# Patient Record
Sex: Female | Born: 1980 | Race: Black or African American | Hispanic: No | Marital: Married | Smoking: Former smoker
Health system: Southern US, Community
[De-identification: ages and names within clinical notes are randomized; demographics above are authoritative.]

## PROBLEM LIST (undated history)

## (undated) DIAGNOSIS — E282 Polycystic ovarian syndrome: Secondary | ICD-10-CM

## (undated) DIAGNOSIS — I10 Essential (primary) hypertension: Secondary | ICD-10-CM

## (undated) DIAGNOSIS — Z8619 Personal history of other infectious and parasitic diseases: Secondary | ICD-10-CM

## (undated) HISTORY — PX: CARDIAC SURGERY: SHX584

## (undated) HISTORY — DX: Polycystic ovarian syndrome: E28.2

## (undated) HISTORY — DX: Personal history of other infectious and parasitic diseases: Z86.19

---
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
## 2002-03-09 ENCOUNTER — Emergency Department (HOSPITAL_COMMUNITY): Admission: EM | Admit: 2002-03-09 | Discharge: 2002-03-09 | Payer: Self-pay | Admitting: Emergency Medicine

## 2002-03-09 ENCOUNTER — Encounter: Payer: Self-pay | Admitting: Emergency Medicine

## 2006-06-05 ENCOUNTER — Inpatient Hospital Stay (HOSPITAL_COMMUNITY): Admission: AD | Admit: 2006-06-05 | Discharge: 2006-06-05 | Payer: Self-pay | Admitting: Family Medicine

## 2007-04-09 ENCOUNTER — Ambulatory Visit (HOSPITAL_COMMUNITY): Admission: RE | Admit: 2007-04-09 | Discharge: 2007-04-09 | Payer: Self-pay | Admitting: Obstetrics and Gynecology

## 2007-06-15 ENCOUNTER — Emergency Department (HOSPITAL_COMMUNITY): Admission: EM | Admit: 2007-06-15 | Discharge: 2007-06-15 | Payer: Self-pay | Admitting: Emergency Medicine

## 2007-10-16 ENCOUNTER — Emergency Department (HOSPITAL_COMMUNITY): Admission: EM | Admit: 2007-10-16 | Discharge: 2007-10-17 | Payer: Self-pay | Admitting: Emergency Medicine

## 2007-12-07 ENCOUNTER — Emergency Department (HOSPITAL_COMMUNITY): Admission: EM | Admit: 2007-12-07 | Discharge: 2007-12-07 | Payer: Self-pay | Admitting: Emergency Medicine

## 2008-01-18 ENCOUNTER — Emergency Department (HOSPITAL_COMMUNITY): Admission: EM | Admit: 2008-01-18 | Discharge: 2008-01-18 | Payer: Self-pay | Admitting: Emergency Medicine

## 2008-02-12 ENCOUNTER — Emergency Department (HOSPITAL_COMMUNITY): Admission: EM | Admit: 2008-02-12 | Discharge: 2008-02-12 | Payer: Self-pay | Admitting: Emergency Medicine

## 2009-02-03 ENCOUNTER — Emergency Department (HOSPITAL_COMMUNITY): Admission: EM | Admit: 2009-02-03 | Discharge: 2009-02-04 | Payer: Self-pay | Admitting: Emergency Medicine

## 2009-08-21 ENCOUNTER — Emergency Department (HOSPITAL_COMMUNITY): Admission: EM | Admit: 2009-08-21 | Discharge: 2009-08-21 | Payer: Self-pay | Admitting: Emergency Medicine

## 2010-11-18 ENCOUNTER — Emergency Department (HOSPITAL_COMMUNITY)
Admission: EM | Admit: 2010-11-18 | Discharge: 2010-11-18 | Payer: Self-pay | Source: Home / Self Care | Admitting: Emergency Medicine

## 2011-01-12 ENCOUNTER — Emergency Department (HOSPITAL_COMMUNITY): Payer: Self-pay

## 2011-01-12 ENCOUNTER — Emergency Department (HOSPITAL_COMMUNITY)
Admission: EM | Admit: 2011-01-12 | Discharge: 2011-01-12 | Disposition: A | Discharge status: Home / Self Care | Payer: Self-pay | Attending: Emergency Medicine | Admitting: Emergency Medicine

## 2011-01-12 DIAGNOSIS — R3915 Urgency of urination: Secondary | ICD-10-CM | POA: Insufficient documentation

## 2011-01-12 DIAGNOSIS — R110 Nausea: Secondary | ICD-10-CM | POA: Insufficient documentation

## 2011-01-12 DIAGNOSIS — I10 Essential (primary) hypertension: Secondary | ICD-10-CM | POA: Insufficient documentation

## 2011-01-12 DIAGNOSIS — N72 Inflammatory disease of cervix uteri: Secondary | ICD-10-CM | POA: Insufficient documentation

## 2011-01-12 DIAGNOSIS — R109 Unspecified abdominal pain: Secondary | ICD-10-CM | POA: Insufficient documentation

## 2011-01-12 LAB — POCT PREGNANCY, URINE: Preg Test, Ur: NEGATIVE

## 2011-01-12 LAB — URINALYSIS, ROUTINE W REFLEX MICROSCOPIC
Bilirubin Urine: NEGATIVE
Glucose, UA: NEGATIVE mg/dL
Hgb urine dipstick: NEGATIVE
Ketones, ur: NEGATIVE mg/dL
Nitrite: NEGATIVE
Protein, ur: NEGATIVE mg/dL
Specific Gravity, Urine: 1.006 (ref 1.005–1.030)
Urobilinogen, UA: 0.2 mg/dL (ref 0.0–1.0)
pH: 7.5 (ref 5.0–8.0)

## 2011-01-12 LAB — URINE MICROSCOPIC-ADD ON

## 2011-01-12 LAB — WET PREP, GENITAL

## 2011-01-13 LAB — GC/CHLAMYDIA PROBE AMP, GENITAL: GC Probe Amp, Genital: NEGATIVE

## 2011-01-13 LAB — URINE CULTURE

## 2011-02-10 LAB — DIFFERENTIAL
Basophils Absolute: 0.1 10*3/uL (ref 0.0–0.1)
Eosinophils Relative: 3 % (ref 0–5)
Monocytes Absolute: 0.5 10*3/uL (ref 0.1–1.0)
Neutrophils Relative %: 53 % (ref 43–77)

## 2011-02-10 LAB — CBC
MCV: 90.4 fL (ref 78.0–100.0)
Platelets: 289 10*3/uL (ref 150–400)
WBC: 9 10*3/uL (ref 4.0–10.5)

## 2011-02-10 LAB — BASIC METABOLIC PANEL
BUN: 5 mg/dL — ABNORMAL LOW (ref 6–23)
Creatinine, Ser: 0.8 mg/dL (ref 0.4–1.2)
GFR calc non Af Amer: >60 mL/min (ref >60)

## 2011-02-10 LAB — URINALYSIS, ROUTINE W REFLEX MICROSCOPIC
Bilirubin Urine: NEGATIVE
Hgb urine dipstick: NEGATIVE
Specific Gravity, Urine: 1.007 (ref 1.005–1.030)
pH: 7 (ref 5.0–8.0)

## 2011-02-10 LAB — URINE MICROSCOPIC-ADD ON

## 2011-02-10 LAB — WET PREP, GENITAL

## 2011-02-10 LAB — PREGNANCY, URINE: Preg Test, Ur: NEGATIVE

## 2011-02-10 LAB — GC/CHLAMYDIA PROBE AMP, GENITAL: GC Probe Amp, Genital: NEGATIVE

## 2011-02-17 LAB — CBC
HCT: 43 % (ref 36.0–46.0)
Hemoglobin: 15.1 g/dL — ABNORMAL HIGH (ref 12.0–15.0)
MCHC: 35 g/dL (ref 30.0–36.0)
MCV: 88.7 fL (ref 78.0–100.0)
RBC: 4.85 MIL/uL (ref 3.87–5.11)

## 2011-02-17 LAB — URINALYSIS, ROUTINE W REFLEX MICROSCOPIC
Bilirubin Urine: NEGATIVE
Glucose, UA: NEGATIVE mg/dL
Hgb urine dipstick: NEGATIVE
Protein, ur: NEGATIVE mg/dL
Urobilinogen, UA: 1 mg/dL (ref 0.0–1.0)

## 2011-02-17 LAB — URINE MICROSCOPIC-ADD ON

## 2011-02-17 LAB — POCT I-STAT, CHEM 8
Calcium, Ion: 1.08 mmol/L — ABNORMAL LOW (ref 1.12–1.32)
Glucose, Bld: 77 mg/dL (ref 70–99)
HCT: 45 % (ref 36.0–46.0)
Hemoglobin: 15.3 g/dL — ABNORMAL HIGH (ref 12.0–15.0)
TCO2: 24 mmol/L (ref 0–100)

## 2011-02-17 LAB — DIFFERENTIAL
Basophils Relative: 0 % (ref 0–1)
Eosinophils Absolute: 0.4 10*3/uL (ref 0.0–0.7)
Eosinophils Relative: 3 % (ref 0–5)
Monocytes Absolute: 0.4 10*3/uL (ref 0.1–1.0)
Monocytes Relative: 3 % (ref 3–12)
Neutrophils Relative %: 63 % (ref 43–77)

## 2011-03-25 NOTE — H&P (Signed)
[facility]  Patient:    Heather Fernandez, Heather Fernandez Visit Number: [id number] MRN: [id number]          Service Type: Attending:  Darreld Mclean, M.D. Dictated by:   Darreld Mclean, M.D.                           History and Physical  CHIEF COMPLAINT:  I hurt my ankle roller-skating.  HISTORY:  This is a 30 year old African-American female who injured her right ankle roller-skating in [place] today.  She fell and had severe pain and tenderness in the right ankle.  She was brought to the hospital.  X-rays revealed a fracture of the right ankle, the distal fibula, distal third with separation, and opening up of the mortis and a small avulsion fracture off of the tip of the medial malleolus and a small fracture of the posterior malleolus.  Therefore she has a trimalleolar fracture.  No other injuries were sustained.  CURRENT MEDICATIONS:  None.  ALLERGIES:  No known drug allergies.  PAST MEDICAL HISTORY:  All negative.  PHYSICAL EXAMINATION  GENERAL:  The patient is alert, cooperative, and oriented.  VITAL SIGNS:  Within normal limits.  HEENT:  Negative.  NECK:  Supple.  LUNGS:  Clear to P&A.  HEART:  Regular, without murmur heard.  ABDOMEN:  Soft, nontender, without masses, slightly obese.  EXTREMITIES:  The right ankle is swollen and tender, with decreased range of motion secondary to pain.  Other extremities are within normal limits.  SKIN:  Intact.  IMPRESSION:  Trauma with a fracture of the right ankle.  PLAN:  An OTIF of the right ankle.  I discussed with the patient the planned procedure, the risks and imponderables.  She appears to understands and agrees to proceed.  Other studies and labs are pending.  The risks and imponderables, including infection, need for crutches, decreased ambulation, and possibility of infection, and the anesthesia risks.  She can have general or spinal, depending upon what she would desire.  She will go home tonight  and come back early in the morning.  I told her about being n.p.o.  A prescription for Tylox given for pain. Dictated by:   Darreld Mclean, M.D. Attending:  Darreld Mclean, M.D. DD:  03/09/02 TD:  03/09/02 Job: [id number]

## 2011-03-27 ENCOUNTER — Observation Stay (HOSPITAL_COMMUNITY)
Admission: EM | Admit: 2011-03-27 | Discharge: 2011-03-29 | DRG: 313 | Disposition: A | Discharge status: Home / Self Care | Payer: Self-pay | Source: Other Acute Inpatient Hospital | Attending: Cardiovascular Disease | Admitting: Cardiovascular Disease

## 2011-03-27 DIAGNOSIS — E669 Obesity, unspecified: Secondary | ICD-10-CM | POA: Insufficient documentation

## 2011-03-27 DIAGNOSIS — I498 Other specified cardiac arrhythmias: Secondary | ICD-10-CM | POA: Insufficient documentation

## 2011-03-27 DIAGNOSIS — I10 Essential (primary) hypertension: Secondary | ICD-10-CM | POA: Insufficient documentation

## 2011-03-27 DIAGNOSIS — R55 Syncope and collapse: Principal | ICD-10-CM | POA: Insufficient documentation

## 2011-03-27 DIAGNOSIS — R0789 Other chest pain: Secondary | ICD-10-CM | POA: Insufficient documentation

## 2011-03-27 DIAGNOSIS — Z95810 Presence of automatic (implantable) cardiac defibrillator: Secondary | ICD-10-CM | POA: Insufficient documentation

## 2011-03-27 LAB — MRSA PCR SCREENING: MRSA by PCR: NEGATIVE

## 2011-03-28 ENCOUNTER — Inpatient Hospital Stay (HOSPITAL_COMMUNITY): Payer: Self-pay

## 2011-03-28 LAB — URINALYSIS, ROUTINE W REFLEX MICROSCOPIC
Ketones, ur: NEGATIVE mg/dL
Nitrite: NEGATIVE
Specific Gravity, Urine: 1.019 (ref 1.005–1.030)
Urobilinogen, UA: 0.2 mg/dL (ref 0.0–1.0)
pH: 6 (ref 5.0–8.0)

## 2011-03-28 LAB — LIPID PANEL
Cholesterol: 183 mg/dL (ref 0–200)
LDL Cholesterol: 130 mg/dL — ABNORMAL HIGH (ref 0–99)
Triglycerides: 155 mg/dL — ABNORMAL HIGH (ref <150)
VLDL: 31 mg/dL (ref 0–40)

## 2011-03-28 LAB — COMPREHENSIVE METABOLIC PANEL
AST: 14 U/L (ref 0–37)
Albumin: 3.6 g/dL (ref 3.5–5.2)
CO2: 26 mEq/L (ref 19–32)
Calcium: 9.1 mg/dL (ref 8.4–10.5)
Creatinine, Ser: 0.79 mg/dL (ref 0.4–1.2)
GFR calc Af Amer: >60 mL/min (ref >60)
GFR calc non Af Amer: >60 mL/min (ref >60)

## 2011-03-28 LAB — CBC
Hemoglobin: 13.4 g/dL (ref 12.0–15.0)
MCH: 30.8 pg (ref 26.0–34.0)
MCHC: 34.7 g/dL (ref 30.0–36.0)
Platelets: 290 10*3/uL (ref 150–400)
RBC: 4.35 MIL/uL (ref 3.87–5.11)

## 2011-03-28 LAB — HEMOGLOBIN A1C
Hgb A1c MFr Bld: 5.3 % (ref <5.7)
Mean Plasma Glucose: 105 mg/dL (ref <117)

## 2011-03-28 LAB — CARDIAC PANEL(CRET KIN+CKTOT+MB+TROPI)
Relative Index: INVALID (ref 0.0–2.5)
Total CK: 61 U/L (ref 7–177)
Total CK: 64 U/L (ref 7–177)
Troponin I: <0.3 ng/mL (ref <0.30)

## 2011-03-28 LAB — TSH: TSH: 2.454 u[IU]/mL (ref 0.350–4.500)

## 2011-03-28 MED ORDER — IOHEXOL 350 MG/ML SOLN
100.0000 mL | Freq: Once | INTRAVENOUS | Status: AC | PRN
  Administered 2011-03-28: 100 mL via INTRAVENOUS

## 2011-03-29 DIAGNOSIS — R55 Syncope and collapse: Secondary | ICD-10-CM

## 2011-03-29 DIAGNOSIS — R002 Palpitations: Secondary | ICD-10-CM

## 2011-04-01 NOTE — Consult Note (Signed)
[name], Heather NO.:  [id number]  MEDICAL RECORD NO.:  [id number]           PATIENT TYPE:  I  LOCATION:  2915                         FACILITY:  [facility]  PHYSICIAN:  Hillis Range, MD       DATE OF BIRTH:  August 07, 1981  DATE OF CONSULTATION: DATE OF DISCHARGE:  03/29/2011                                CONSULTATION   REQUESTING PHYSICIAN:  Landry Corporal, MD  REASON FOR CONSULTATION:  Syncope and tachycardia.  HISTORY OF PRESENT ILLNESS:  [name] is a pleasant 30 year old female with a longstanding postural hypotension and syncope as well as hypertension who presents following an episode of syncope.  She states that since high school she has had episodes of syncope.  Episodes are always upon standing.  She denies any prior syncope at rest while sitting or lying down.  She previously was evaluated and had a loop monitor implanted.  She has had no arrhythmias or syncope previously documented since her loop implantation.  She reports doing reasonably well recently.  She does have difficulties with hypertension long term. She states that on Mar 27, 2011, that she was at church when she began having a shocking "sensation" throughout her chest.  These were fleeting pain within her chest.  She reports being quite anxious with this and subsequently short of breath.  She states that she then became very nervous and felt that her heart rate gradually increased.  She stood up and went to go outside of the church when she had lost consciousness. She apparently had prolonged loss of consciousness.  Per report, CPR was delivered; however, we have a documentation of arrhythmias.  The patient was subsequently brought to [facility].  Upon EMS arrival per report, the patient was stable, and her vitals were quite normal. Apparently, her blood pressure was 119/65 with the heart rate of 97. She was brought to [facility] for further management.  Her  loop interrogator has interrogated and reveals a gradual onset tachycardia. No other arrhythmias have been observed.  Presently, the patient was resting comfortably and is without complaint.  PAST MEDICAL HISTORY: 1. Obesity. 2. Hypertension. 3. Postural syncope, status post implantable loop recorder     implantation.  ALLERGIES:  HYDROCODONE and TYLENOL.  HOME MEDICATIONS: 1. Lisinopril 30 mg daily. 2. Hydrochlorothiazide previously 100 mg daily, most recently on 50 mg     daily. 3. Metoprolol 50 mg b.i.d.  SOCIAL HISTORY:  The patient is married and has no children.  She works as a Lawyer.  She recently quit smoking.  She denies alcohol or drug use.  FAMILY HISTORY:  She denies any family history of syncope or sudden death.  REVIEW OF SYSTEMS:  All systems were reviewed and negative except as outlined in the HPI above.  PHYSICAL EXAMINATION:  GENERAL:  Telemetry reveals sinus rhythm at 70 beats per minute. VITAL SIGNS:  Blood pressure 116/90, heart rate 63, respirations 18, sats 97% on room air, afebrile. GENERAL:  The patient is an obese, but pleasant female, in no acute distress.  She is alert and oriented x3. HEENT:  Normocephalic,  atraumatic.  Sclerae clear.  Conjunctivae pink. Oropharynx clear. NECK:  Supple.  No thyromegaly, JVD, or bruits. LUNGS:  Clear to auscultation bilaterally. HEART:  Regular rate and rhythm.  No murmurs, rubs, or gallops. GI:  Soft, nontender, nondistended.  Positive bowel sounds. EXTREMITIES:  No clubbing, cyanosis, or edema. SKIN:  No ecchymoses or lacerations. MUSCULOSKELETAL:  No deformity or atrophy. PSYCH:  Euthymic mood.  Full affect.  EKG reveals sinus rhythm at 78 beats per minute and is otherwise a normal EKG.  Echocardiogram reveals an ejection fraction of 55-60% with no wall motion abnormalities.  CT scan reveals no evidence of pulmonary emboli. Cardiac markers are negative.  TSH 2.45.  IMPLANTABLE LOOP RECORDER  INTERROGATION:  The patient's loop recorder was interrogated and found to be a Medtronic XT model 9529 device.  She has had transient episodes of sinus tachycardia documented on Mar 27, 2011, and previously in June 20, 2010.  These were the only arrhythmias observed.  These are very clearly a gradual onset of sinus tachycardia.  Heart rate is up to 180 beats per minute.  No other arrhythmias are observed.  IMPRESSION:  [name] is a pleasant 30 year old female with longstanding postural hypotension and syncope who now presents with a syncopal episode.  By history, it appears that she became anxious after having atypical chest pain and developed sinus tachycardia in the setting of this anxiety.  She then got up quickly to go out of the church and apparently had an episode of postural syncope.  I think that the patient's high doses of hydrochlorothiazide are certainly contributing to her postural syncope, and should therefore be eliminated if possible.  We will decrease hydrochlorothiazide today to 25 mg daily and hopefully can be subsequently discontinued if the blood pressure remains stable.  Her loop was interrogated today and reveals only sinus tachycardia.  If she has any further arrhythmias, then we will be happy to evaluate her further.  No further EP recommendations are made at this time.     Hillis Range, MD     JA/MEDQ  D:  03/29/2011  T:  03/30/2011  Job:  [id number]  Electronically Signed by Hillis Range MD on 04/01/2011 05:55:15 PM

## 2011-04-12 ENCOUNTER — Other Ambulatory Visit (HOSPITAL_COMMUNITY): Payer: Self-pay | Admitting: Internal Medicine

## 2011-04-21 ENCOUNTER — Ambulatory Visit (HOSPITAL_COMMUNITY)
Admission: RE | Admit: 2011-04-21 | Discharge: 2011-04-21 | Disposition: A | Discharge status: Home / Self Care | Payer: Self-pay | Source: Ambulatory Visit | Attending: Internal Medicine | Admitting: Internal Medicine

## 2011-04-21 ENCOUNTER — Ambulatory Visit (HOSPITAL_COMMUNITY): Payer: Self-pay

## 2011-04-21 ENCOUNTER — Other Ambulatory Visit (HOSPITAL_COMMUNITY): Payer: Self-pay | Admitting: Internal Medicine

## 2011-04-21 ENCOUNTER — Ambulatory Visit (HOSPITAL_COMMUNITY): Admission: RE | Admit: 2011-04-21 | Payer: Self-pay | Source: Ambulatory Visit

## 2011-04-21 DIAGNOSIS — R079 Chest pain, unspecified: Secondary | ICD-10-CM | POA: Insufficient documentation

## 2011-04-21 DIAGNOSIS — I10 Essential (primary) hypertension: Secondary | ICD-10-CM | POA: Insufficient documentation

## 2011-04-21 DIAGNOSIS — R55 Syncope and collapse: Secondary | ICD-10-CM | POA: Insufficient documentation

## 2011-04-21 MED ORDER — TECHNETIUM TC 99M TETROFOSMIN IV KIT
30.0000 | PACK | Freq: Once | INTRAVENOUS | Status: AC | PRN
  Administered 2011-04-21: 30 via INTRAVENOUS

## 2011-04-21 MED ORDER — TECHNETIUM TC 99M TETROFOSMIN IV KIT
10.0000 | PACK | Freq: Once | INTRAVENOUS | Status: AC | PRN
  Administered 2011-04-21: 10 via INTRAVENOUS

## 2011-04-21 NOTE — Discharge Summary (Signed)
Heather Fernandez, Heather Fernandez NO.:  [id number]  MEDICAL RECORD NO.:  [id number]           PATIENT TYPE:  I  LOCATION:  2915                         FACILITY:  [facility]  PHYSICIAN:  Nicki Guadalajara, M.D.     DATE OF BIRTH:  Sep 17, 1981  DATE OF ADMISSION:  03/27/2011 DATE OF DISCHARGE:  03/29/2011                              DISCHARGE SUMMARY   DISCHARGE DIAGNOSES: 1. Atypical chest pain with associated sinus tachycardia. 2. Syncope. 3. History of longstanding postural syncope. 4. Abnormal D-dimer negative for pulmonary embolus by CT angio. 5. Normal LV function by 2-D echo. 6. History of loop recorder revealing sinus tach at 180-190 on the     loop recorder.  DISCHARGE CONDITION:  Stable.  DISCHARGE INSTRUCTIONS: 1. Activity as tolerated.  Heart-healthy diet.  Follow up with Dr.     Rennis Golden, on April 13, 2011, at noon. 2. You are scheduled for stress test on April 11, 2011, at 11:15 a.m.,     wear a two-piece clothing, she was to walk in, no perfume or strong     deodorant, do not eat or drink after midnight the night before the     test.  DISCHARGE MEDICATIONS: 1. Aspirin 81 mg daily. 2. Hydrochlorothiazide new dose only 25 mg daily. 3. Metoprolol tartrate new dose 75 mg twice a day. 4. Lisinopril 30 mg daily as before.  Please note, 2-D echo revealed systolic function was normal, EF 55-60% with normal wall motion.  No regional wall motion abnormalities.  Left ventricular diastolic function parameters were normal.  Aortic valve, no regurgitation, aortic root was normal.  Mitral valve, no regurgitation. Tricuspid valve, no regurgitation.  No pericardial effusion.  Please note, EP consult was obtained, sinus tachycardia.  HISTORY OF PRESENT ILLNESS:  A 30 year old female was admitted from [facility] after a syncopal episode.  She has a history of tachycardia, palpitations and postural syncope.  On the day of admission, she was sitting in church and she stated  her defibrillator fired several times.  Unfortunately, this is a misunderstanding of a loop recorder.  She does not have an ICD.  I am not sure what she felt, but when she got up to go outside she awoke with people standing over her.  The patient told to the admitting provider that her sister did CPR on her.  The family stated she was unconscious for 10 minutes.  She also states her defibrillator has fired several times in the past and she also states she has no memory as to what happened to her.  At [facility], she was in sinus rhythm at 83.  She complained of headache which continued despite Toradol and morphine.  CT of her head was negative. Cardiac enzymes were negative and it was found she actually had a loop recorder which was placed for irregular heartbeat.  PAST MEDICAL HISTORY: 1. Medtronic reveal a loop implant. 2. Hypertension. 3. Obesity. 4. History of ankle fracture or other history.  She had seen Dr. Theodis Aguas and Dr. Lynnea Ferrier in the past and will now be seeing Dr. Rennis Golden.  She was kempt.  Her D-dimer was  found to be elevated.  CT angio of her chest was done which was negative for any pulmonary embolus and no intrathoracic abnormalities.  Chest x-ray on admission, no acute chest findings.  LABORATORY DATA:  Hemoglobin 13.7, hematocrit 38.6, platelets 290, WBC 10.2, D-dimer as stated was elevated at 2.06.  Sodium 140, potassium 3.5, chloride 105, CO2 26, glucose 96, BUN 12, creatinine 0.79, total bili 0.3, alkaline phos 64, SGOT 14, SGPT 14, total protein 7.3, albumin 3.6, calcium 9.1, magnesium 2.4, hemoglobin A1c 5.3.  CK is 66-64, MB 1.1 and troponin I less than 0.30.  Total cholesterol 183, triglycerides 155, HDL 22, LDL 130.  TSH 2.454. UA was clear and a MRSA screen was negative.  Also, she had a pregnancy urine test done at [facility] which was negative for pregnancy.  EKG revealed sinus rhythm, normal EKG.  The patient was seen and examined, an EP was to be  consulted, they saw her and Dr. Johney Frame felt this was atypical chest pain with associated sinus tachycardia, long- standing postural syncope.  He felt she should have her HCTZ decreased to 25 daily and would eventually wean off and she needs to maintain her preload.  She should not drive, and follow up with Dr. Rennis Golden her primary cardiologist at this point.  On the loop recorder, she had gradual episodes of tachycardia with heart rate up to 188.  He felt she was stable to be discharged and she will follow up as instructed.  We will also do an outpatient stress test with her chest pain that she had.  Please note, she was seen at [facility].  Originally, her name was [employer].     Heather Fernandez. Heather Fernandez, N.P.   ______________________________ Nicki Guadalajara, M.D.    LRI/MEDQ  D:  03/29/2011  T:  03/30/2011  Job:  [id number]  cc:   Italy Hilty, MD  Electronically Signed by Nada Boozer N.P. on 04/06/2011 04:39:18 PM Electronically Signed by Nicki Guadalajara M.D. on 04/21/2011 04:10:14 PM

## 2011-05-05 ENCOUNTER — Ambulatory Visit (HOSPITAL_COMMUNITY)
Admission: RE | Admit: 2011-05-05 | Discharge: 2011-05-05 | Disposition: A | Discharge status: Home / Self Care | Payer: Self-pay | Source: Ambulatory Visit | Attending: Cardiovascular Disease | Admitting: Cardiovascular Disease

## 2011-05-05 DIAGNOSIS — F17200 Nicotine dependence, unspecified, uncomplicated: Secondary | ICD-10-CM | POA: Insufficient documentation

## 2011-05-05 DIAGNOSIS — R55 Syncope and collapse: Secondary | ICD-10-CM | POA: Insufficient documentation

## 2011-05-05 DIAGNOSIS — I10 Essential (primary) hypertension: Secondary | ICD-10-CM | POA: Insufficient documentation

## 2011-05-07 ENCOUNTER — Emergency Department (HOSPITAL_COMMUNITY)
Admission: EM | Admit: 2011-05-07 | Discharge: 2011-05-07 | Disposition: A | Discharge status: Home / Self Care | Payer: Self-pay | Attending: Emergency Medicine | Admitting: Emergency Medicine

## 2011-05-07 DIAGNOSIS — Z7982 Long term (current) use of aspirin: Secondary | ICD-10-CM | POA: Insufficient documentation

## 2011-05-07 DIAGNOSIS — I10 Essential (primary) hypertension: Secondary | ICD-10-CM | POA: Insufficient documentation

## 2011-05-07 DIAGNOSIS — Z79899 Other long term (current) drug therapy: Secondary | ICD-10-CM | POA: Insufficient documentation

## 2011-05-07 DIAGNOSIS — R110 Nausea: Secondary | ICD-10-CM | POA: Insufficient documentation

## 2011-05-07 DIAGNOSIS — R071 Chest pain on breathing: Secondary | ICD-10-CM | POA: Insufficient documentation

## 2011-05-07 LAB — CBC
HCT: 39.4 % (ref 36.0–46.0)
Hemoglobin: 13.6 g/dL (ref 12.0–15.0)
MCH: 30.4 pg (ref 26.0–34.0)
MCV: 87.9 fL (ref 78.0–100.0)
RBC: 4.48 MIL/uL (ref 3.87–5.11)

## 2011-05-07 LAB — BASIC METABOLIC PANEL
BUN: 6 mg/dL (ref 6–23)
CO2: 27 mEq/L (ref 19–32)
Calcium: 9.2 mg/dL (ref 8.4–10.5)
Chloride: 103 mEq/L (ref 96–112)
Creatinine, Ser: 0.8 mg/dL (ref 0.50–1.10)
Glucose, Bld: 93 mg/dL (ref 70–99)

## 2011-05-14 NOTE — Op Note (Signed)
  [name], [name] NO.:  [id number]  MEDICAL RECORD NO.:  [id number]  LOCATION:  [facility]                         FACILITY:  [facility]  PHYSICIAN:  Thurmon Fair, MD     DATE OF BIRTH:  [birth year]  DATE OF PROCEDURE:  05/05/2011 DATE OF DISCHARGE:  05/05/2011                              OPERATIVE REPORT   PROCEDURE PERFORMED:  Removal of implantable loop recorder.  [name] is a young woman with previous unexplained palpitations and syncope who has had an implantable loop recorder in place for 2 years. No arrhythmia has been recorded.  No recurrent syncope has been noted.  MEDICATIONS ADMINISTERED DURING PROCEDURE:  Ancef 1 g intravenously, lidocaine 1% 20 mL locally, Versed and fentanyl for analgesia and light sedation.  DEVICE DETAIL:  The explanted loop recorder is a Medtronic Reveal XT 9529 inserted on October 07, 2009, serial number is [id number].  COMPLICATIONS:  None.  ESTIMATED BLOOD LOSS:  Less than 10 mL.  After risks and benefits of the procedure were described, the patient provided informed consent and was brought to the Cardiac Cath Lab in the fasting state.  Local anesthesia with 1% lidocaine was administered to the area of the previous scar.  A 2.5-cm horizontal incision was created at the level of previous scar and the pocket was carefully exposed using blunt dissection and limited electrocautery.  The loop recorder was easily removed from the pocket and the pocket was then closed in layers, a deep layer of 2-0 Vicryl and a subcuticular layer of 4-0 Vicryl were used.  A sterile dressing was applied.  Estimated blood loss was less than 10 mL.  No complications occurred.     Thurmon Fair, MD     MC/MEDQ  D:  05/05/2011  T:  05/06/2011  Job:  [id number]  cc:   [facility]  Electronically Signed by Thurmon Fair M.D. on 05/14/2011 08:18:06 AM

## 2011-07-21 ENCOUNTER — Emergency Department (HOSPITAL_COMMUNITY)
Admission: EM | Admit: 2011-07-21 | Discharge: 2011-07-21 | Disposition: A | Discharge status: Home / Self Care | Payer: Self-pay | Attending: Emergency Medicine | Admitting: Emergency Medicine

## 2011-07-21 DIAGNOSIS — I10 Essential (primary) hypertension: Secondary | ICD-10-CM | POA: Insufficient documentation

## 2011-07-21 DIAGNOSIS — I498 Other specified cardiac arrhythmias: Secondary | ICD-10-CM | POA: Insufficient documentation

## 2011-07-21 DIAGNOSIS — R0789 Other chest pain: Secondary | ICD-10-CM | POA: Insufficient documentation

## 2011-07-21 LAB — POCT I-STAT, CHEM 8
BUN: 7 mg/dL (ref 6–23)
Creatinine, Ser: 0.8 mg/dL (ref 0.50–1.10)
Glucose, Bld: 101 mg/dL — ABNORMAL HIGH (ref 70–99)
Hemoglobin: 14.6 g/dL (ref 12.0–15.0)
TCO2: 27 mmol/L (ref 0–100)

## 2011-07-28 LAB — BASIC METABOLIC PANEL
CO2: 30
Calcium: 9.3
Chloride: 106
Creatinine, Ser: 0.87
GFR calc Af Amer: >60
Sodium: 140

## 2011-07-28 LAB — URINALYSIS, ROUTINE W REFLEX MICROSCOPIC
Glucose, UA: NEGATIVE
Hgb urine dipstick: NEGATIVE
Protein, ur: NEGATIVE
Specific Gravity, Urine: 1.011
Urobilinogen, UA: 0.2

## 2011-07-28 LAB — PREGNANCY, URINE: Preg Test, Ur: NEGATIVE

## 2011-07-28 LAB — DIFFERENTIAL
Lymphocytes Relative: 34
Lymphs Abs: 3.3
Monocytes Absolute: 0.8
Monocytes Relative: 9
Neutro Abs: 4.8
Neutrophils Relative %: 50

## 2011-07-28 LAB — CBC
Hemoglobin: 14.3
RBC: 4.68
WBC: 9.5

## 2011-08-01 LAB — URINALYSIS, ROUTINE W REFLEX MICROSCOPIC
Bilirubin Urine: NEGATIVE
Glucose, UA: NEGATIVE
Nitrite: NEGATIVE
Specific Gravity, Urine: 1.015
pH: 7

## 2011-08-01 LAB — CBC
HCT: 40.2
Hemoglobin: 13.7
MCHC: 34.2
MCV: 87.6
RBC: 4.58
WBC: 9.8

## 2011-08-01 LAB — I-STAT 8, (EC8 V) (CONVERTED LAB)
BUN: 6
Bicarbonate: 25.3 — ABNORMAL HIGH
HCT: 45
Operator id: 285841
TCO2: 26
pCO2, Ven: 40 — ABNORMAL LOW

## 2011-08-01 LAB — DIFFERENTIAL
Basophils Relative: 0
Eosinophils Relative: 0
Monocytes Relative: 2 — ABNORMAL LOW
Neutrophils Relative %: 56
nRBC: 0

## 2011-08-01 LAB — POCT CARDIAC MARKERS
Myoglobin, poc: 44.8
Operator id: 285841

## 2011-08-01 LAB — POCT I-STAT CREATININE: Creatinine, Ser: 0.8

## 2011-08-03 ENCOUNTER — Emergency Department (HOSPITAL_COMMUNITY)
Admission: EM | Admit: 2011-08-03 | Discharge: 2011-08-03 | Disposition: A | Discharge status: Home / Self Care | Payer: Self-pay | Attending: Emergency Medicine | Admitting: Emergency Medicine

## 2011-08-03 ENCOUNTER — Emergency Department (HOSPITAL_COMMUNITY): Admission: EM | Admit: 2011-08-03 | Payer: Self-pay | Source: Home / Self Care

## 2011-08-03 DIAGNOSIS — R51 Headache: Secondary | ICD-10-CM | POA: Insufficient documentation

## 2011-08-03 DIAGNOSIS — M62838 Other muscle spasm: Secondary | ICD-10-CM | POA: Insufficient documentation

## 2011-08-03 DIAGNOSIS — M79609 Pain in unspecified limb: Secondary | ICD-10-CM | POA: Insufficient documentation

## 2011-08-03 DIAGNOSIS — M542 Cervicalgia: Secondary | ICD-10-CM | POA: Insufficient documentation

## 2011-08-03 DIAGNOSIS — I10 Essential (primary) hypertension: Secondary | ICD-10-CM | POA: Insufficient documentation

## 2011-08-03 DIAGNOSIS — R55 Syncope and collapse: Secondary | ICD-10-CM | POA: Insufficient documentation

## 2011-08-03 DIAGNOSIS — R079 Chest pain, unspecified: Secondary | ICD-10-CM | POA: Insufficient documentation

## 2011-08-15 LAB — DIFFERENTIAL
Eosinophils Absolute: 0.3
Lymphocytes Relative: 39
Lymphs Abs: 3.9
Neutrophils Relative %: 49

## 2011-08-15 LAB — COMPREHENSIVE METABOLIC PANEL
ALT: 19
CO2: 27
Calcium: 9.2
Creatinine, Ser: 0.91
GFR calc non Af Amer: >60
Glucose, Bld: 85

## 2011-08-15 LAB — URINALYSIS, ROUTINE W REFLEX MICROSCOPIC
Bilirubin Urine: NEGATIVE
Hgb urine dipstick: NEGATIVE
Specific Gravity, Urine: 1.01
pH: 7.5

## 2011-08-15 LAB — CBC
Hemoglobin: 14.4
MCHC: 33.9
MCV: 88.7
RBC: 4.77

## 2011-08-15 LAB — POCT PREGNANCY, URINE: Preg Test, Ur: NEGATIVE

## 2011-08-15 LAB — LIPASE, BLOOD: Lipase: 23

## 2011-08-22 LAB — DIFFERENTIAL
Basophils Absolute: 0
Basophils Relative: 1
Eosinophils Absolute: 0.4
Eosinophils Relative: 4
Lymphocytes Relative: 35

## 2011-08-22 LAB — I-STAT 8, (EC8 V) (CONVERTED LAB)
Acid-base deficit: 1
Bicarbonate: 24.4 — ABNORMAL HIGH
HCT: 50 — ABNORMAL HIGH
Operator id: 234501
TCO2: 26
pCO2, Ven: 40.8 — ABNORMAL LOW

## 2011-08-22 LAB — CBC
HCT: 46.3 — ABNORMAL HIGH
MCHC: 34
MCV: 88.1
Platelets: 311
RDW: 13.5

## 2011-08-22 LAB — POCT I-STAT CREATININE: Operator id: 234501

## 2011-08-22 LAB — URINALYSIS, ROUTINE W REFLEX MICROSCOPIC
Hgb urine dipstick: NEGATIVE
Ketones, ur: NEGATIVE
Protein, ur: NEGATIVE
Urobilinogen, UA: 0.2

## 2011-08-22 LAB — POCT PREGNANCY, URINE: Preg Test, Ur: NEGATIVE

## 2011-08-22 LAB — PREGNANCY, URINE: Preg Test, Ur: NEGATIVE

## 2011-09-05 ENCOUNTER — Emergency Department (HOSPITAL_COMMUNITY)
Admission: EM | Admit: 2011-09-05 | Discharge: 2011-09-05 | Disposition: A | Discharge status: Home / Self Care | Payer: Self-pay | Attending: Emergency Medicine | Admitting: Emergency Medicine

## 2011-09-05 DIAGNOSIS — Y93G1 Activity, food preparation and clean up: Secondary | ICD-10-CM | POA: Insufficient documentation

## 2011-09-05 DIAGNOSIS — W261XXA Contact with sword or dagger, initial encounter: Secondary | ICD-10-CM | POA: Insufficient documentation

## 2011-09-05 DIAGNOSIS — M79609 Pain in unspecified limb: Secondary | ICD-10-CM | POA: Insufficient documentation

## 2011-09-05 DIAGNOSIS — I10 Essential (primary) hypertension: Secondary | ICD-10-CM | POA: Insufficient documentation

## 2011-09-05 DIAGNOSIS — W260XXA Contact with knife, initial encounter: Secondary | ICD-10-CM | POA: Insufficient documentation

## 2011-09-05 DIAGNOSIS — S61209A Unspecified open wound of unspecified finger without damage to nail, initial encounter: Secondary | ICD-10-CM | POA: Insufficient documentation

## 2011-09-05 DIAGNOSIS — Z79899 Other long term (current) drug therapy: Secondary | ICD-10-CM | POA: Insufficient documentation

## 2011-09-19 ENCOUNTER — Other Ambulatory Visit (HOSPITAL_COMMUNITY)
Admission: RE | Admit: 2011-09-19 | Discharge: 2011-09-19 | Disposition: A | Discharge status: Home / Self Care | Payer: Medicaid Other | Source: Ambulatory Visit | Attending: Obstetrics & Gynecology | Admitting: Obstetrics & Gynecology

## 2011-09-19 DIAGNOSIS — Z113 Encounter for screening for infections with a predominantly sexual mode of transmission: Secondary | ICD-10-CM | POA: Insufficient documentation

## 2011-09-19 DIAGNOSIS — Z01419 Encounter for gynecological examination (general) (routine) without abnormal findings: Secondary | ICD-10-CM | POA: Insufficient documentation

## 2012-02-01 ENCOUNTER — Other Ambulatory Visit (HOSPITAL_COMMUNITY)
Admission: RE | Admit: 2012-02-01 | Discharge: 2012-02-01 | Disposition: A | Discharge status: Home / Self Care | Payer: Medicaid Other | Source: Ambulatory Visit | Attending: Obstetrics and Gynecology | Admitting: Obstetrics and Gynecology

## 2012-02-01 DIAGNOSIS — Z01419 Encounter for gynecological examination (general) (routine) without abnormal findings: Secondary | ICD-10-CM | POA: Insufficient documentation

## 2012-02-01 DIAGNOSIS — Z113 Encounter for screening for infections with a predominantly sexual mode of transmission: Secondary | ICD-10-CM | POA: Insufficient documentation

## 2012-04-01 ENCOUNTER — Encounter (HOSPITAL_COMMUNITY): Payer: Self-pay | Admitting: *Deleted

## 2012-04-01 ENCOUNTER — Emergency Department (HOSPITAL_COMMUNITY)
Admission: EM | Admit: 2012-04-01 | Discharge: 2012-04-02 | Disposition: A | Discharge status: Home / Self Care | Payer: Medicaid Other | Attending: Emergency Medicine | Admitting: Emergency Medicine

## 2012-04-01 ENCOUNTER — Emergency Department (HOSPITAL_COMMUNITY): Payer: Medicaid Other

## 2012-04-01 DIAGNOSIS — W2209XA Striking against other stationary object, initial encounter: Secondary | ICD-10-CM | POA: Insufficient documentation

## 2012-04-01 DIAGNOSIS — T148XXA Other injury of unspecified body region, initial encounter: Secondary | ICD-10-CM

## 2012-04-01 DIAGNOSIS — Y9289 Other specified places as the place of occurrence of the external cause: Secondary | ICD-10-CM | POA: Insufficient documentation

## 2012-04-01 DIAGNOSIS — R51 Headache: Secondary | ICD-10-CM | POA: Insufficient documentation

## 2012-04-01 DIAGNOSIS — I10 Essential (primary) hypertension: Secondary | ICD-10-CM | POA: Insufficient documentation

## 2012-04-01 DIAGNOSIS — S0083XA Contusion of other part of head, initial encounter: Secondary | ICD-10-CM | POA: Insufficient documentation

## 2012-04-01 DIAGNOSIS — S0003XA Contusion of scalp, initial encounter: Secondary | ICD-10-CM | POA: Insufficient documentation

## 2012-04-01 HISTORY — DX: Essential (primary) hypertension: I10

## 2012-04-01 NOTE — ED Notes (Signed)
Pt reports striking head at work on Thursday.  States that since that time, she's had a headache that doesn't get better with OTC Aleve.  Reports occasional dizziness and sleepiness.  Reports some nausea, no vomiting.

## 2012-04-02 MED ORDER — PROMETHAZINE HCL 25 MG PO TABS: 12.5000 mg | ORAL_TABLET | Freq: Four times a day (QID) | ORAL | Status: DC | PRN

## 2012-04-02 MED ORDER — ACETAMINOPHEN 500 MG PO TABS
ORAL_TABLET | ORAL | Status: AC
  Filled 2012-04-02: qty 2

## 2012-04-02 MED ORDER — ACETAMINOPHEN 500 MG PO TABS: 1000.0000 mg | ORAL_TABLET | Freq: Once | ORAL | Status: DC

## 2012-04-02 NOTE — ED Provider Notes (Signed)
History     CSN: [id number]  Arrival date & time 04/01/12  2323   First MD Initiated Contact with Patient 04/01/12 2337      Chief Complaint  Patient presents with  . Headache    (Consider location/radiation/quality/duration/timing/severity/associated sxs/prior treatment) HPI  Linlee APT is a 31 y.o. female who presents to the Emergency Department complaining of headache, nausea, dizziness and sleepiness after having hit her head while at work on Thursday. She hit the left temporal area with the handlebar of a loading platform. At the time there was no LOC. She did have focal pain at the site. Subsequently she has developed headaches, some dizziness on occasion, and sleepiness. She is also to experience some nausea but no vomiting. She denies fever, chills, cough, shortness of breath. She has taken Tylenol for the headache with some relief.  No PCP Past Medical History  Diagnosis Date  . Hypertension     Past Surgical History  Procedure Date  . Cardiac surgery     History reviewed. No pertinent family history.  History  Substance Use Topics  . Smoking status: Current Everyday Smoker  . Smokeless tobacco: Not on file  . Alcohol Use: No    OB History    Grav Para Term Preterm Abortions TAB SAB Ect Mult Living                  Review of Systems  Constitutional: Negative for fever.       10 Systems reviewed and are negative for acute change except as noted in the HPI.  HENT: Negative for congestion.   Eyes: Negative for discharge and redness.  Respiratory: Negative for cough and shortness of breath.   Cardiovascular: Negative for chest pain.  Gastrointestinal: Negative for vomiting and abdominal pain.  Musculoskeletal: Negative for back pain.  Skin: Negative for rash.  Neurological: Positive for dizziness and headaches. Negative for syncope and numbness.  Psychiatric/Behavioral:       No behavior change.    Allergies  Review of patient's allergies indicates  no known allergies.  Home Medications   Current Outpatient Rx  Name Route Sig Dispense Refill  . HYDROCHLOROTHIAZIDE 25 MG PO TABS Oral Take 25 mg by mouth daily.    Marland Kitchen LISINOPRIL 20 MG PO TABS Oral Take 20 mg by mouth daily.    Marland Kitchen LISINOPRIL 5 MG PO TABS Oral Take 5 mg by mouth daily.      BP 147/100  Pulse 92  Temp 98.2 F (36.8 C)  Resp 18  Ht 4\' 11"  (1.499 m)  Wt 195 lb (88.451 kg)  BMI 39.39 kg/m2  SpO2 99%  LMP 03/18/2012  Physical Exam  Nursing note and vitals reviewed. Constitutional: She is oriented to person, place, and time.       Awake, alert, nontoxic appearance.  HENT:  Head: Atraumatic.  Eyes: Right eye exhibits no discharge. Left eye exhibits no discharge.  Neck: Neck supple.  Cardiovascular: Normal rate, normal heart sounds and intact distal pulses.   Pulmonary/Chest: Effort normal. She exhibits no tenderness.  Abdominal: Soft. There is no tenderness. There is no rebound.  Musculoskeletal: She exhibits no tenderness.       Baseline ROM, no obvious new focal weakness.  Neurological: She is alert and oriented to person, place, and time. She has normal reflexes. She displays normal reflexes. No cranial nerve deficit. Coordination normal.       Mental status and motor strength appears baseline for patient and situation.  Skin:  No rash noted.  Psychiatric: She has a normal mood and affect.    ED Course  Procedures (including critical care time)  Ct Head Wo Contrast  04/02/2012  *RADIOLOGY REPORT*  Clinical Data:  Hit head 4 days ago.  Headache with nausea.  CT HEAD WITHOUT CONTRAST  Technique:  Contiguous axial images were obtained from the base of the skull through the vertex without contrast  Comparison:  None.  Findings:  The brain has a normal appearance without evidence for hemorrhage, acute infarction, hydrocephalus, or mass lesion.  There is no extra axial fluid collection.  The skull and paranasal sinuses are normal.  IMPRESSION: Normal CT of the head  without contrast.  Original Report Authenticated By: Elsie Stain, M.D.        MDM  Patient here status post hitting her head at work on Thursday with results sent intermittent headache, nausea, dizziness, and sleepiness. CT is negative for any acute process. We reviewed the results with the patient.Pt stable in ED with no significant deterioration in condition.The patient appears reasonably screened and/or stabilized for discharge and I doubt any other medical condition or other [facility] requiring further screening, evaluation, or treatment in the ED at this time prior to discharge.  MDM Reviewed: nursing note and vitals Interpretation: CT scan           Nicoletta Dress. Colon Branch, MD 04/02/12 1610

## 2012-04-02 NOTE — Discharge Instructions (Signed)
Your  CT was negative. He may treat the headache with either Tylenol or ibuprofen. Use the nausea medicine as needed. Her scalp may be similar at the area where it was hit. This is a bruise and will go away by itself after several days.    Contusion A contusion is a deep bruise. Contusions happen when an injury causes bleeding under the skin. Signs of bruising include pain, puffiness (swelling), and discolored skin. The contusion may turn blue, purple, or yellow. HOME CARE   Put ice on the injured area.   Put ice in a plastic bag.   Place a towel between your skin and the bag.   Leave the ice on for 15 to 20 minutes, 3 to 4 times a day.   Only take medicine as told by your doctor.   Rest the injured area.   If possible, raise (elevate) the injured area to lessen puffiness.  GET HELP RIGHT AWAY IF:   You have more bruising or puffiness.   You have pain that is getting worse.   Your puffiness or pain is not helped by medicine.  MAKE SURE YOU:   Understand these instructions.   Will watch your condition.   Will get help right away if you are not doing well or get worse.  Document Released: 04/11/2008 Document Revised: 10/13/2011 Document Reviewed: 08/29/2011 [facility] Patient Information 2012 [place], [place].

## 2012-04-02 NOTE — ED Notes (Signed)
Patient c/o increased pain to head.

## 2012-06-12 ENCOUNTER — Encounter (HOSPITAL_COMMUNITY): Payer: Self-pay

## 2012-06-12 ENCOUNTER — Emergency Department (HOSPITAL_COMMUNITY)
Admission: EM | Admit: 2012-06-12 | Discharge: 2012-06-13 | Disposition: A | Discharge status: Home / Self Care | Payer: Medicaid Other | Attending: Emergency Medicine | Admitting: Emergency Medicine

## 2012-06-12 DIAGNOSIS — Z87891 Personal history of nicotine dependence: Secondary | ICD-10-CM | POA: Insufficient documentation

## 2012-06-12 DIAGNOSIS — R112 Nausea with vomiting, unspecified: Secondary | ICD-10-CM

## 2012-06-12 DIAGNOSIS — I10 Essential (primary) hypertension: Secondary | ICD-10-CM | POA: Insufficient documentation

## 2012-06-12 DIAGNOSIS — R109 Unspecified abdominal pain: Secondary | ICD-10-CM

## 2012-06-12 DIAGNOSIS — R1011 Right upper quadrant pain: Secondary | ICD-10-CM | POA: Insufficient documentation

## 2012-06-12 DIAGNOSIS — Z79899 Other long term (current) drug therapy: Secondary | ICD-10-CM | POA: Insufficient documentation

## 2012-06-12 MED ORDER — ONDANSETRON HCL 4 MG/2ML IJ SOLN
4.0000 mg | Freq: Once | INTRAMUSCULAR | Status: AC
  Administered 2012-06-13: 4 mg via INTRAVENOUS
  Filled 2012-06-12: qty 2

## 2012-06-12 MED ORDER — SODIUM CHLORIDE 0.9 % IV BOLUS (SEPSIS)
1000.0000 mL | Freq: Once | INTRAVENOUS | Status: AC
  Administered 2012-06-13: 1000 mL via INTRAVENOUS

## 2012-06-12 NOTE — ED Notes (Signed)
Lab at bedside to draw blood.

## 2012-06-12 NOTE — ED Notes (Signed)
MD at bedside. 

## 2012-06-12 NOTE — ED Notes (Signed)
Pt c/o pain in mid abd and now with vomiting

## 2012-06-12 NOTE — ED Provider Notes (Signed)
History  This chart was scribed for Vida Roller, MD by Erskine Emery. This patient was seen in room [facility] and the patient's care was started at 23:32.   CSN: [id number]  Arrival date & time 06/12/12  2303   First MD Initiated Contact with Patient 06/12/12 2332      Chief Complaint  Patient presents with  . Abdominal Pain  . Emesis    (Consider location/radiation/quality/duration/timing/severity/associated sxs/prior treatment) The history is provided by the patient and the spouse.   Heather Fernandez is a 31 y.o. female who presents to the Emergency Department complaining of nausea and emesis (red in color) with associated abdominal pain and cough after each emesis episode since 8pm last night. Pt reports she was just sitting on the cough when the symptoms began. Pt reports between then and 6am this morning, she has about 6-8 episodes of emesis. Pt reports she has been having loose stools, but that is baseline for her. Pt denies any fevers, chills, dysuria, difficulty urinating, or abnormal exertion. Pt denies being on any new medications but is taking prenatal vitamins in an effort to get pregnant. Pt reports she hasn't taken a pregnancy test in a while. Her LNMP was in June but she had major vaginal bleeding last month for 2 weeks, not at the time of her usual period.  Pt is G1 P0 A1, and did not get sick during her first pregnancy. Pt denies taking any medication for the nausea PTA and she has no h/o abdominal surgery.   Past Medical History  Diagnosis Date  . Hypertension     Past Surgical History  Procedure Date  . Cardiac surgery     No family history on file.  History  Substance Use Topics  . Smoking status: Former Games developer  . Smokeless tobacco: Not on file  . Alcohol Use: No    OB History    Grav Para Term Preterm Abortions TAB SAB Ect Mult Living                  Review of Systems  All other systems reviewed and are negative.   A complete 10 system review  of systems was obtained and all systems are negative except as noted in the HPI and PMH.    Allergies  Tylenol  Home Medications   Current Outpatient Rx  Name Route Sig Dispense Refill  . HYDROCHLOROTHIAZIDE 25 MG PO TABS Oral Take 25 mg by mouth daily.    Marland Kitchen LISINOPRIL 5 MG PO TABS Oral Take 5 mg by mouth 3 (three) times daily.     Marland Kitchen LISINOPRIL 20 MG PO TABS Oral Take 20 mg by mouth daily.    Marland Kitchen ONDANSETRON 4 MG PO TBDP Oral Take 1 tablet (4 mg total) by mouth every 8 (eight) hours as needed for nausea. 10 tablet 0  . PROMETHAZINE HCL 25 MG PO TABS Oral Take 0.5 tablets (12.5 mg total) by mouth every 6 (six) hours as needed for nausea. 10 tablet 0  . PROMETHAZINE HCL 25 MG PO TABS Oral Take 1 tablet (25 mg total) by mouth every 6 (six) hours as needed for nausea. 12 tablet 0  . TRAMADOL HCL 50 MG PO TABS Oral Take 1 tablet (50 mg total) by mouth every 6 (six) hours as needed for pain. 15 tablet 0    Triage Vitals: BP 179/113  Pulse 109  Temp 97.9 F (36.6 C) (Oral)  Resp 16  Ht 4\' 10"  (1.473 m)  Wt  175 lb (79.379 kg)  BMI 36.57 kg/m2  SpO2 100%  LMP 04/21/2012  Physical Exam  Nursing note and vitals reviewed. Constitutional: She is oriented to person, place, and time. She appears well-developed and well-nourished. No distress.  HENT:  Head: Normocephalic and atraumatic.  Mouth/Throat: Oropharynx is clear and moist. No oropharyngeal exudate.  Eyes: EOM are normal.  Neck: Neck supple. No tracheal deviation present.  Cardiovascular: Normal heart sounds.  Tachycardia present.   No murmur heard.      Mildly tachycardic.  Pulmonary/Chest: Effort normal. No respiratory distress.  Abdominal: Soft. Bowel sounds are normal. She exhibits no distension.       Epigastric and RUQ tenderness. No CVA tenderness.    Musculoskeletal: Normal range of motion. She exhibits no edema.  Neurological: She is alert and oriented to person, place, and time.  Skin: Skin is warm and dry.    Psychiatric: She has a normal mood and affect.    ED Course  Procedures (including critical care time) DIAGNOSTIC STUDIES: Oxygen Saturation is 100% on room air, normal by my interpretation.    COORDINATION OF CARE: 23:40--I evaluated the patient and we discussed a treatment plan including fluids and nausea medication to which the pt agreed.     Labs Reviewed  CBC WITH DIFFERENTIAL - Abnormal; Notable for the following:    WBC 14.9 (*)     Neutro Abs 9.5 (*)     Lymphs Abs 4.3 (*)     All other components within normal limits  COMPREHENSIVE METABOLIC PANEL - Abnormal; Notable for the following:    AST 39 (*)     ALT 58 (*)     All other components within normal limits  URINALYSIS, ROUTINE W REFLEX MICROSCOPIC - Abnormal; Notable for the following:    Hgb urine dipstick TRACE (*)     Ketones, ur TRACE (*)     All other components within normal limits  URINE MICROSCOPIC-ADD ON - Abnormal; Notable for the following:    Squamous Epithelial / LPF FEW (*)     Bacteria, UA FEW (*)     All other components within normal limits  LIPASE, BLOOD  PREGNANCY, URINE  POCT PREGNANCY, URINE   Ct Abdomen Pelvis W Contrast  06/13/2012  *RADIOLOGY REPORT*  Clinical Data: Right upper quadrant abdominal pain and vomiting. Leukocytosis.  CT ABDOMEN AND PELVIS WITH CONTRAST  Technique:  Multidetector CT imaging of the abdomen and pelvis was performed following the standard protocol during bolus administration of intravenous contrast.  Contrast: OMNIPAQUE IOHEXOL 300 MG/ML  SOLN  Comparison: None.  Findings: Probable dependent atelectasis in the lung bases.  Diffuse low attenuation change throughout the liver consistent with mild fatty infiltration.  No focal liver lesions demonstrated.  The gallbladder is contracted.  The spleen, pancreas, adrenal glands, kidneys, abdominal aorta, and retroperitoneal lymph nodes are unremarkable.  The stomach, small bowel, and colon are not abnormally distended.   No free air or free fluid in the abdomen.  Pelvis:  The uterus and adnexal structures are not enlarged.  The bladder wall is not thickened.  No free or loculated pelvic fluid collections.  No evidence of diverticulitis.  The appendix is normal.  Normal alignment of the lumbar vertebrae.  IMPRESSION: No acute process demonstrated in the abdomen or pelvis.  Original Report Authenticated By: Marlon Pel, M.D.     1. Abdominal pain   2. Nausea and vomiting       MDM  Initially the patient  had pain in the right upper quadrant and epigastrium which waned throughout her visit. She had intermittent nausea and vomiting which also responded very well to medications. Her blood work showed that she had a leukocytosis and a slight transaminitis. I have explained to the patient that she has these findings and she has expressed her understanding and indications for followup. She will return 24 hours for a recheck should she still be having pain or return to see her Dr.  CT scan reading reviewed, no signs of appendicitis, cholecystitis or other abnormalities.   I personally performed the services described in this documentation, which was scribed in my presence. The recorded information has been reviewed and considered.    Discharge Prescriptions include:  Phenergan zofran Ultram      Vida Roller, MD 06/13/12 [phone]

## 2012-06-12 NOTE — ED Notes (Signed)
Patient has had several episodes of vomiting since last night. Burning pain right above umbilicus that comes and goes. Tender on palpation of middle abdomen. Last BM was this morning and normal.

## 2012-06-13 ENCOUNTER — Emergency Department (HOSPITAL_COMMUNITY): Payer: Medicaid Other

## 2012-06-13 LAB — CBC WITH DIFFERENTIAL/PLATELET
Basophils Relative: 0 % (ref 0–1)
Eosinophils Absolute: 0.2 10*3/uL (ref 0.0–0.7)
MCH: 30.8 pg (ref 26.0–34.0)
MCHC: 34.3 g/dL (ref 30.0–36.0)
Neutrophils Relative %: 64 % (ref 43–77)
Platelets: 368 10*3/uL (ref 150–400)
RBC: 4.26 MIL/uL (ref 3.87–5.11)

## 2012-06-13 LAB — COMPREHENSIVE METABOLIC PANEL
ALT: 58 U/L — ABNORMAL HIGH (ref 0–35)
Albumin: 4.1 g/dL (ref 3.5–5.2)
Alkaline Phosphatase: 65 U/L (ref 39–117)
Potassium: 3.8 mEq/L (ref 3.5–5.1)
Sodium: 136 mEq/L (ref 135–145)
Total Protein: 8.2 g/dL (ref 6.0–8.3)

## 2012-06-13 LAB — URINALYSIS, ROUTINE W REFLEX MICROSCOPIC
Bilirubin Urine: NEGATIVE
Nitrite: NEGATIVE
Specific Gravity, Urine: 1.02 (ref 1.005–1.030)
pH: 6 (ref 5.0–8.0)

## 2012-06-13 LAB — POCT PREGNANCY, URINE: Preg Test, Ur: NEGATIVE

## 2012-06-13 LAB — URINE MICROSCOPIC-ADD ON

## 2012-06-13 MED ORDER — PROMETHAZINE HCL 25 MG/ML IJ SOLN
25.0000 mg | Freq: Once | INTRAMUSCULAR | Status: AC
  Administered 2012-06-13: 25 mg via INTRAVENOUS
  Filled 2012-06-13: qty 1

## 2012-06-13 MED ORDER — PROMETHAZINE HCL 25 MG PO TABS: 25.0000 mg | ORAL_TABLET | Freq: Four times a day (QID) | ORAL | Status: DC | PRN

## 2012-06-13 MED ORDER — TRAMADOL HCL 50 MG PO TABS: 50.0000 mg | ORAL_TABLET | Freq: Four times a day (QID) | ORAL | Status: AC | PRN

## 2012-06-13 MED ORDER — HYDROMORPHONE HCL PF 1 MG/ML IJ SOLN
1.0000 mg | Freq: Once | INTRAMUSCULAR | Status: AC
  Administered 2012-06-13: 1 mg via INTRAVENOUS
  Filled 2012-06-13: qty 1

## 2012-06-13 MED ORDER — ONDANSETRON HCL 4 MG/2ML IJ SOLN
4.0000 mg | Freq: Once | INTRAMUSCULAR | Status: AC
  Administered 2012-06-13: 4 mg via INTRAVENOUS
  Filled 2012-06-13: qty 2

## 2012-06-13 MED ORDER — ONDANSETRON 4 MG PO TBDP
4.0000 mg | ORAL_TABLET | Freq: Three times a day (TID) | ORAL | Status: AC | PRN
Start: 2012-06-13 — End: 2012-06-20

## 2012-06-13 MED ORDER — IOHEXOL 300 MG/ML  SOLN
100.0000 mL | Freq: Once | INTRAMUSCULAR | Status: AC | PRN
  Administered 2012-06-13: 100 mL via INTRAVENOUS

## 2012-06-13 NOTE — ED Notes (Signed)
Actively vomiting at this time. MD aware.

## 2012-06-13 NOTE — ED Notes (Signed)
Into room to see patient. Resting sitting up in bed. States nausea is ok. Denies any needs. Tolerating contrast. Call bell within reach.

## 2012-06-13 NOTE — ED Notes (Signed)
Assisted to bathroom to obtain urine specimen clean catch.

## 2012-06-13 NOTE — ED Notes (Signed)
Denies any pain at this time. Nausea is better. Call bell within reach. Denies needs. Family at bedside. Will continue to monitor.

## 2012-06-13 NOTE — ED Notes (Signed)
Patient to radiology via stretcher. No distress. Denies pain. Nausea better. Call bell within reach. Will continue to monitor.

## 2012-06-13 NOTE — ED Notes (Signed)
Resting sitting up in bed. Medicated as ordered for 8\10 pain. Denies needs. No distress. Bed in low position and locked with side rails up. Will continue to monitor. Family at bedside.

## 2013-01-05 IMAGING — CR DG CHEST 1V PORT
1 series · 1 of 1 positions shown · non-contrast
Comparison: 08/21/2009

CLINICAL DATA: Chest pain

PORTABLE CHEST - 1 VIEW

[AP]
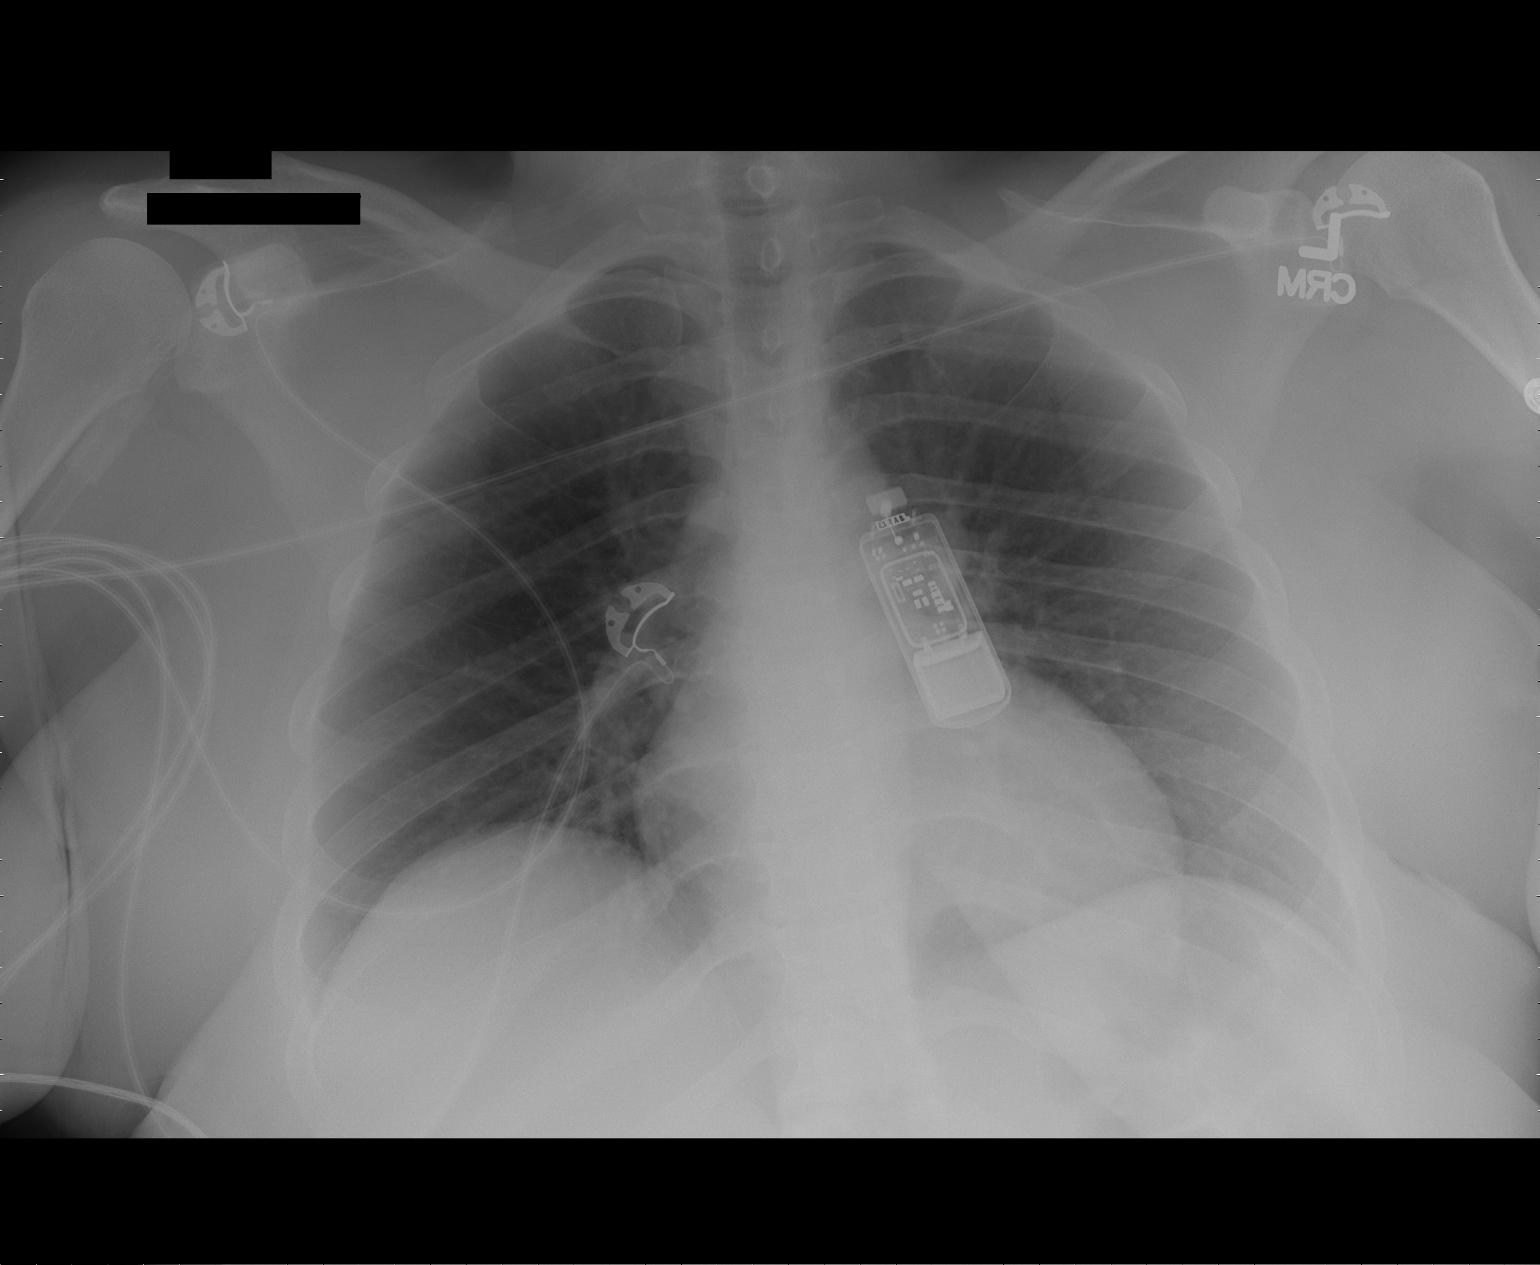

[1 of 1 positions shown; findings below may reference images not displayed]

FINDINGS: Normal heart size and vascularity.  Negative for edema,
pneumonia, effusion or pneumothorax.  External monitor device
projects over the left hilum.  Midline trachea.
IMPRESSION: No acute chest finding.

## 2013-02-21 ENCOUNTER — Other Ambulatory Visit: Payer: Self-pay | Admitting: Adult Health

## 2013-03-22 ENCOUNTER — Encounter: Payer: Self-pay | Admitting: *Deleted

## 2013-03-22 DIAGNOSIS — I10 Essential (primary) hypertension: Secondary | ICD-10-CM

## 2013-03-22 DIAGNOSIS — Z8619 Personal history of other infectious and parasitic diseases: Secondary | ICD-10-CM | POA: Insufficient documentation

## 2013-03-22 DIAGNOSIS — E282 Polycystic ovarian syndrome: Secondary | ICD-10-CM | POA: Insufficient documentation

## 2013-03-25 ENCOUNTER — Other Ambulatory Visit: Payer: Self-pay | Admitting: Adult Health

## 2013-03-25 ENCOUNTER — Encounter: Payer: Self-pay | Admitting: *Deleted

## 2014-03-24 IMAGING — CT CT ABD-PELV W/ CM
2 of 3 series · 16 of 46 positions shown, 18 images · IV contrast (omnipaque)
Comparison: None.

CLINICAL DATA: Right upper quadrant abdominal pain and vomiting.
Leukocytosis.

CT ABDOMEN AND PELVIS WITH CONTRAST
TECHNIQUE: Multidetector CT imaging of the abdomen and pelvis was
performed following the standard protocol during bolus
administration of intravenous contrast.
Contrast: 100mL OMNIPAQUE IOHEXOL 300 MG/ML  SOLN

[Series 2: abd_pel_with 5.0 b40f · axial · 0.70mm/px · z∈[-418,-12]mm · 13 of 93 slices shown, 15 images]
[im 6/93  soft-tissue]
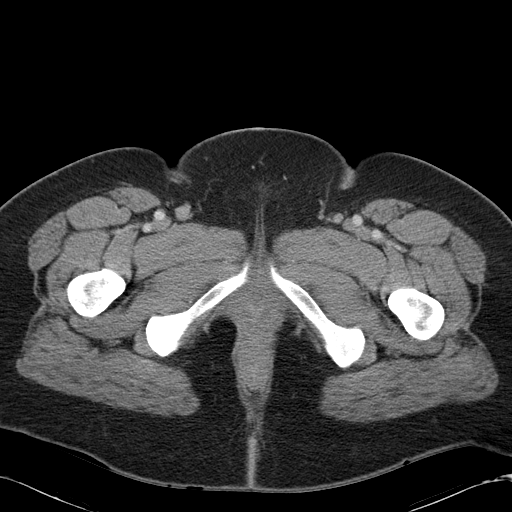
[im 6/93  bone]
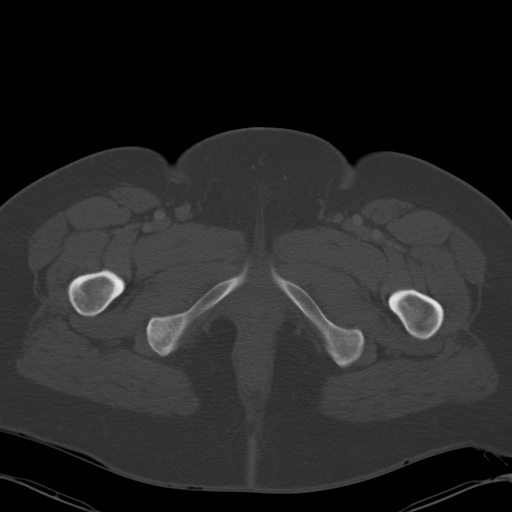
[im 12/93  soft-tissue]
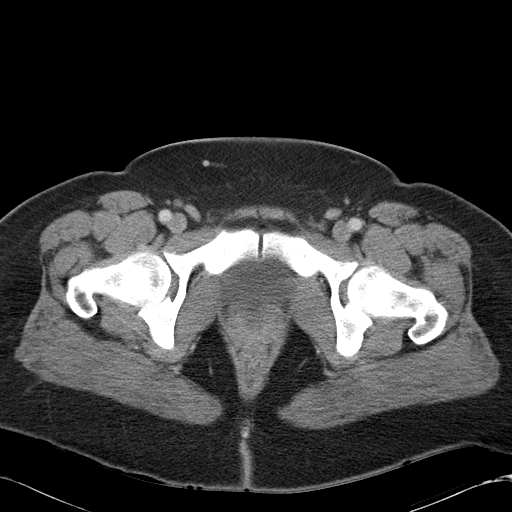
[im 18/93  soft-tissue]
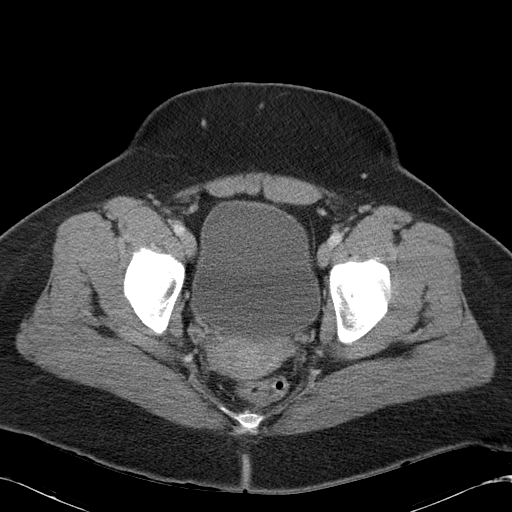
[im 27/93  soft-tissue]
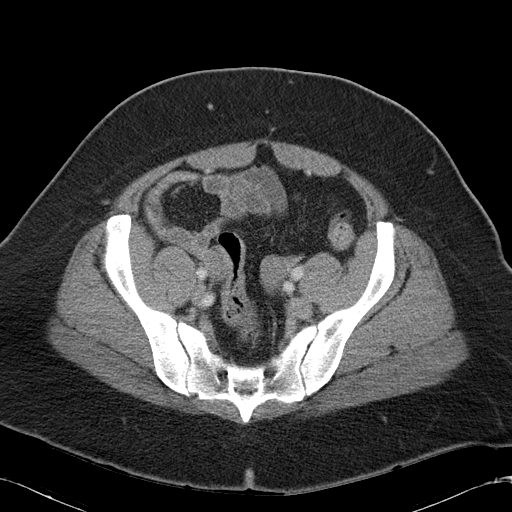
[im 33/93  soft-tissue]
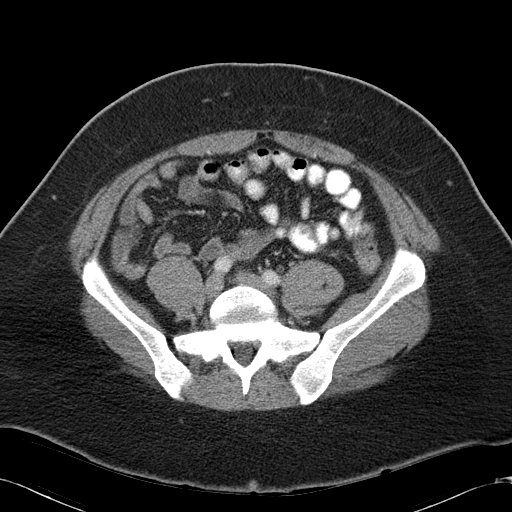
[im 39/93  soft-tissue]
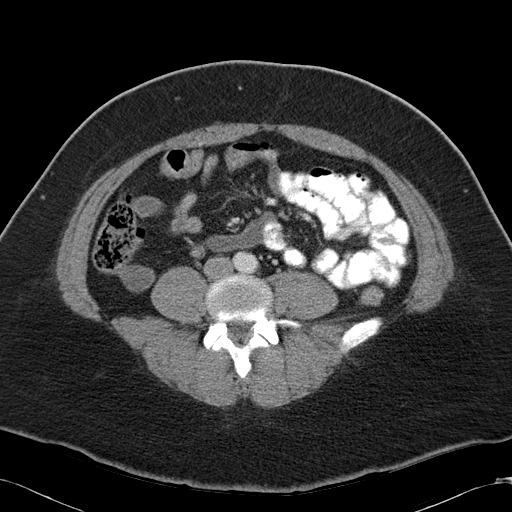
[im 48/93  soft-tissue]
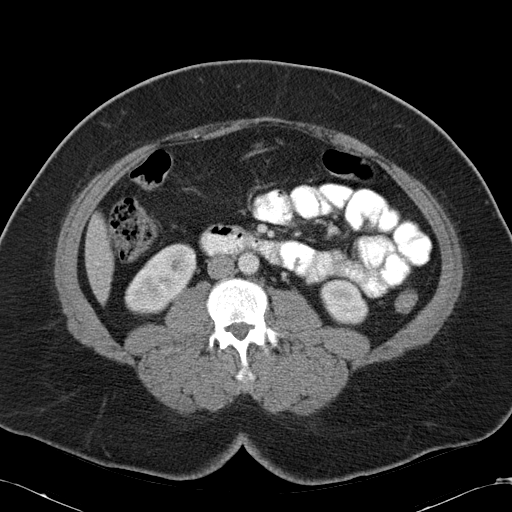
[im 54/93  soft-tissue]
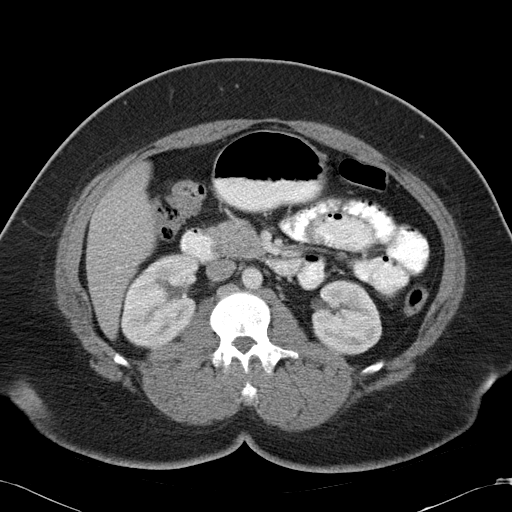
[im 60/93  soft-tissue]
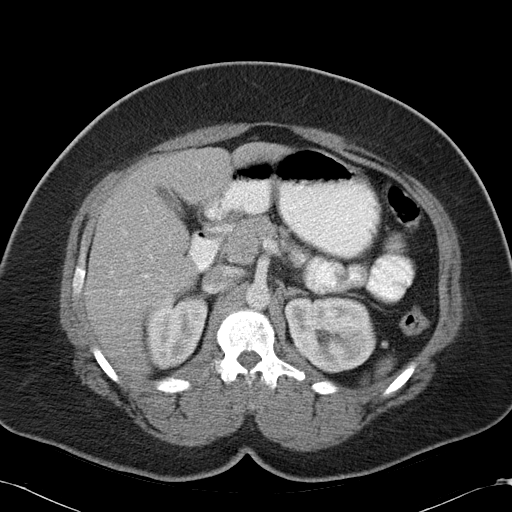
[im 60/93  bone]
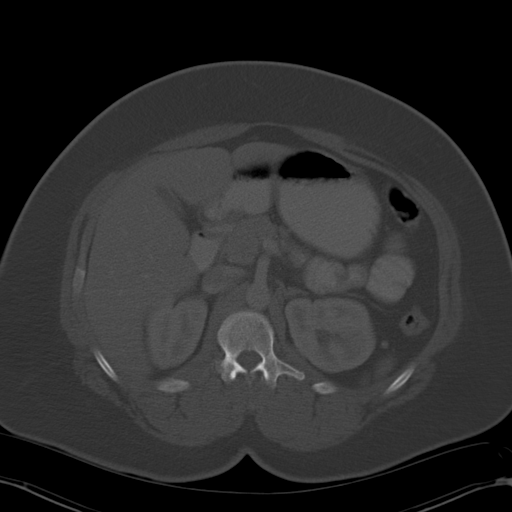
[im 66/93  soft-tissue]
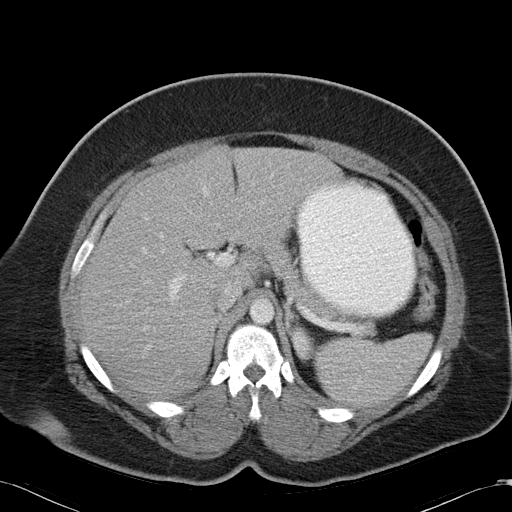
[im 75/93  soft-tissue]
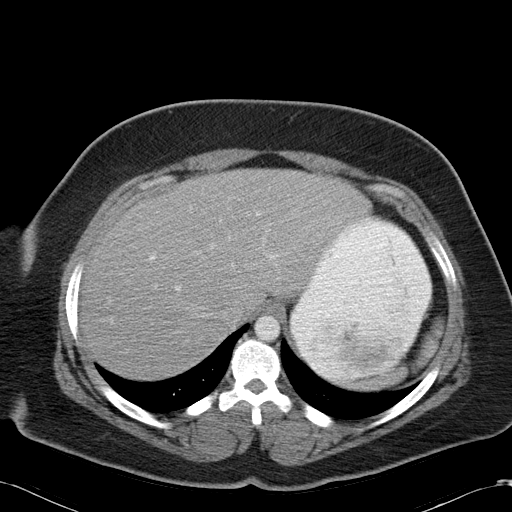
[im 81/93  soft-tissue]
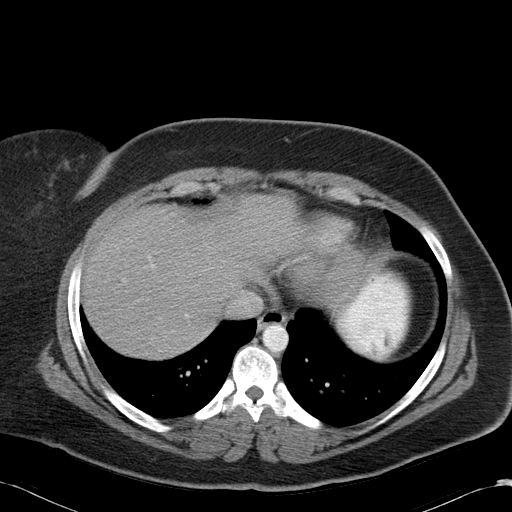
[im 87/93  soft-tissue]
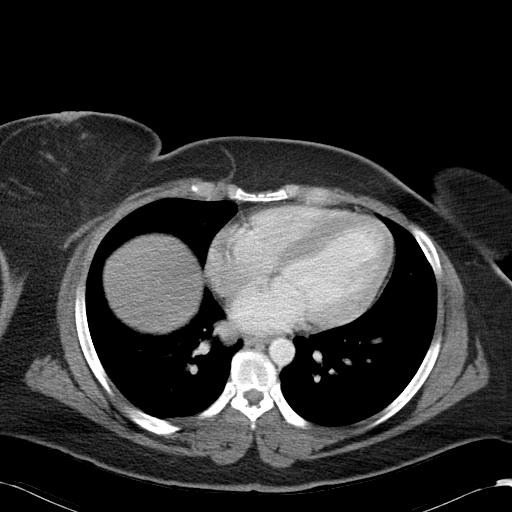

[Series 4: abd_pel_with 3.0 spo cor · coronal · 0.68mm/px · 3 of 97 slices shown]
[im 33/97  soft-tissue]
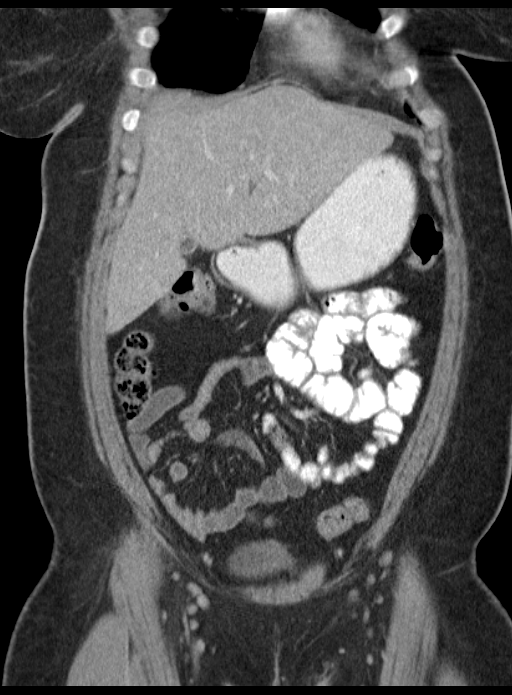
[im 43/97  soft-tissue]
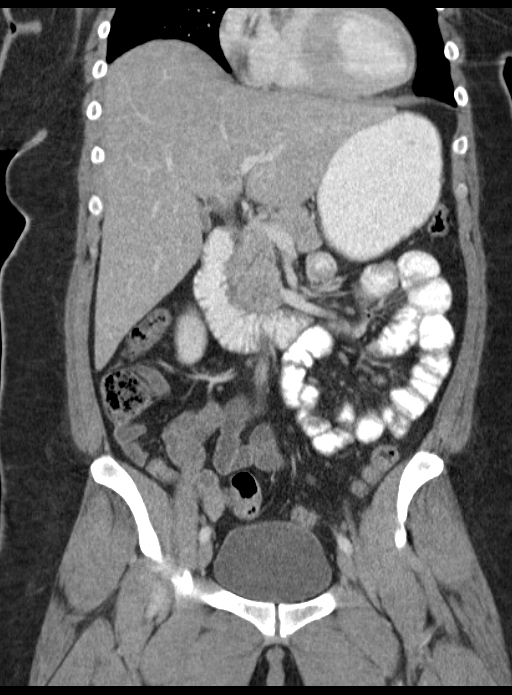
[im 54/97  soft-tissue]
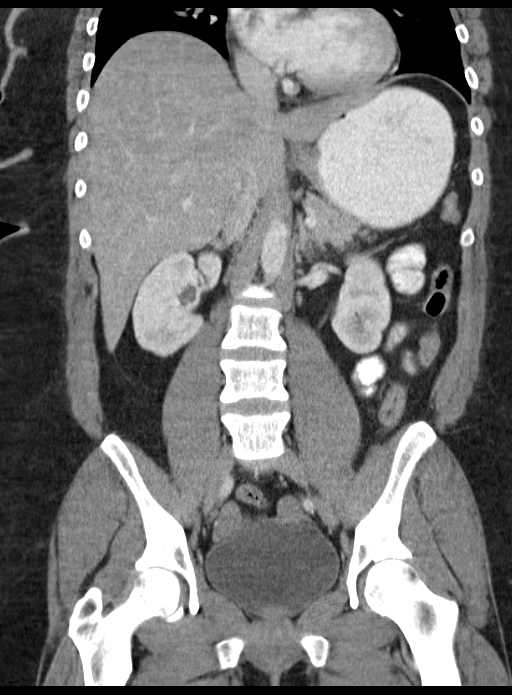

[16 of 46 positions shown; findings below may reference images not displayed]

FINDINGS: Probable dependent atelectasis in the lung bases.

Diffuse low attenuation change throughout the liver consistent with
mild fatty infiltration.  No focal liver lesions demonstrated.  The
gallbladder is contracted.  The spleen, pancreas, adrenal glands,
kidneys, abdominal aorta, and retroperitoneal lymph nodes are
unremarkable.  The stomach, small bowel, and colon are not
abnormally distended.  No free air or free fluid in the abdomen.

Pelvis:  The uterus and adnexal structures are not enlarged.  The
bladder wall is not thickened.  No free or loculated pelvic fluid
collections.  No evidence of diverticulitis.  The appendix is
normal.  Normal alignment of the lumbar vertebrae.
IMPRESSION: No acute process demonstrated in the abdomen or pelvis.

## 2014-12-19 ENCOUNTER — Encounter: Payer: Self-pay | Admitting: *Deleted

## 2016-07-19 ENCOUNTER — Other Ambulatory Visit (HOSPITAL_COMMUNITY)
Admission: RE | Admit: 2016-07-19 | Discharge: 2016-07-19 | Disposition: A | Discharge status: Home / Self Care | Payer: Medicaid Other | Source: Ambulatory Visit | Attending: Unknown Physician Specialty | Admitting: Unknown Physician Specialty

## 2016-07-19 DIAGNOSIS — N841 Polyp of cervix uteri: Secondary | ICD-10-CM | POA: Insufficient documentation

## 2016-12-09 ENCOUNTER — Emergency Department (HOSPITAL_COMMUNITY)
Admission: EM | Admit: 2016-12-09 | Discharge: 2016-12-10 | Disposition: A | Discharge status: Home / Self Care | Payer: Self-pay | Attending: Emergency Medicine | Admitting: Emergency Medicine

## 2016-12-09 ENCOUNTER — Emergency Department (HOSPITAL_COMMUNITY): Payer: Self-pay

## 2016-12-09 ENCOUNTER — Encounter (HOSPITAL_COMMUNITY): Payer: Self-pay | Admitting: *Deleted

## 2016-12-09 DIAGNOSIS — J0300 Acute streptococcal tonsillitis, unspecified: Secondary | ICD-10-CM | POA: Insufficient documentation

## 2016-12-09 DIAGNOSIS — R1110 Vomiting, unspecified: Secondary | ICD-10-CM | POA: Insufficient documentation

## 2016-12-09 DIAGNOSIS — R0789 Other chest pain: Secondary | ICD-10-CM | POA: Insufficient documentation

## 2016-12-09 DIAGNOSIS — Z79899 Other long term (current) drug therapy: Secondary | ICD-10-CM | POA: Insufficient documentation

## 2016-12-09 DIAGNOSIS — N898 Other specified noninflammatory disorders of vagina: Secondary | ICD-10-CM | POA: Insufficient documentation

## 2016-12-09 DIAGNOSIS — I10 Essential (primary) hypertension: Secondary | ICD-10-CM | POA: Insufficient documentation

## 2016-12-09 DIAGNOSIS — Z87891 Personal history of nicotine dependence: Secondary | ICD-10-CM | POA: Insufficient documentation

## 2016-12-09 LAB — CBC WITH DIFFERENTIAL/PLATELET
BASOS ABS: 0.1 10*3/uL (ref 0.0–0.1)
Basophils Relative: 0 %
Eosinophils Absolute: 0.3 10*3/uL (ref 0.0–0.7)
Eosinophils Relative: 2 %
HEMATOCRIT: 40.5 % (ref 36.0–46.0)
HEMOGLOBIN: 13.9 g/dL (ref 12.0–15.0)
LYMPHS ABS: 4 10*3/uL (ref 0.7–4.0)
LYMPHS PCT: 26 %
MCH: 31 pg (ref 26.0–34.0)
MCHC: 34.3 g/dL (ref 30.0–36.0)
MCV: 90.2 fL (ref 78.0–100.0)
Monocytes Absolute: 1.3 10*3/uL — ABNORMAL HIGH (ref 0.1–1.0)
Monocytes Relative: 8 %
NEUTROS ABS: 9.8 10*3/uL — AB (ref 1.7–7.7)
NEUTROS PCT: 64 %
Platelets: 295 10*3/uL (ref 150–400)
RBC: 4.49 MIL/uL (ref 3.87–5.11)
RDW: 14.3 % (ref 11.5–15.5)
WBC: 15.4 10*3/uL — AB (ref 4.0–10.5)

## 2016-12-09 LAB — COMPREHENSIVE METABOLIC PANEL
ALK PHOS: 62 U/L (ref 38–126)
ALT: 31 U/L (ref 14–54)
AST: 22 U/L (ref 15–41)
Albumin: 3.9 g/dL (ref 3.5–5.0)
Anion gap: 12 (ref 5–15)
BUN: 6 mg/dL (ref 6–20)
CALCIUM: 9 mg/dL (ref 8.9–10.3)
CHLORIDE: 100 mmol/L — AB (ref 101–111)
CO2: 26 mmol/L (ref 22–32)
CREATININE: 0.87 mg/dL (ref 0.44–1.00)
Glucose, Bld: 88 mg/dL (ref 65–99)
Potassium: 3.3 mmol/L — ABNORMAL LOW (ref 3.5–5.1)
Sodium: 138 mmol/L (ref 135–145)
Total Bilirubin: 0.3 mg/dL (ref 0.3–1.2)
Total Protein: 7.6 g/dL (ref 6.5–8.1)

## 2016-12-09 MED ORDER — DEXAMETHASONE SODIUM PHOSPHATE 10 MG/ML IJ SOLN
10.0000 mg | Freq: Once | INTRAMUSCULAR | Status: AC
  Administered 2016-12-09: 10 mg via INTRAMUSCULAR
  Filled 2016-12-09: qty 1

## 2016-12-09 MED ORDER — IBUPROFEN 400 MG PO TABS
600.0000 mg | ORAL_TABLET | Freq: Once | ORAL | Status: AC
Start: 2016-12-09 — End: 2016-12-09
  Administered 2016-12-09: 600 mg via ORAL
  Filled 2016-12-09: qty 2

## 2016-12-09 MED ORDER — AZITHROMYCIN 250 MG PO TABS
500.0000 mg | ORAL_TABLET | Freq: Once | ORAL | Status: AC
  Administered 2016-12-09: 500 mg via ORAL
  Filled 2016-12-09: qty 2

## 2016-12-09 NOTE — ED Triage Notes (Signed)
Pt reports cough, congestion, sore throat, body aches, sob, chest pain when she takes a deep breath, headaches, 2 episodes of emesis. Pt states she has not checked to see if she has had a fever. Pt's tem in triage 98.9

## 2016-12-09 NOTE — ED Provider Notes (Signed)
[facility]-EMERGENCY DEPT Provider Note   CSN: [id number] Arrival date & time: 12/09/16  2054  By signing my name below, I, Alyssa Grove, attest that this documentation has been prepared under the direction and in the presence of Loren Racer, MD. Electronically Signed: Alyssa Grove, ED Scribe. 12/09/16. 9:59 PM.   History   Chief Complaint Chief Complaint  Patient presents with  . Cough   The history is provided by the patient. No language interpreter was used.   HPI Comments: Heather Fernandez is a 36 y.o. female who presents to the Emergency Department complaining of gradual onset, constant, moderate, unchanged sore throat beginning earlier today. She reports associated vomiting (x3 today), throbbing headache, chills, generalized body aches, diaphoresis, and dry cough. Pt has not checked for a fever. She is allergic to Tylenol and has not tried any other treatments. She is also allergic to Amoxicillin and Penicillin. Pt denies rhinorrhea, congestion, abdominal pain, nausea.   Past Medical History:  Diagnosis Date  . History of trichomoniasis   . Hypertension   . PCOS (polycystic ovarian syndrome)     Patient Active Problem List   Diagnosis Date Noted  . History of trichomoniasis 03/22/2013  . PCOS (polycystic ovarian syndrome) 03/22/2013  . High blood pressure 03/22/2013    Past Surgical History:  Procedure Laterality Date  . ANKLE SURGERY  2002  . CARDIAC SURGERY     defibrillator placed and removed. Pt states they put it in to see why she was blacking out.     OB History    No data available       Home Medications    Prior to Admission medications   Medication Sig Start Date End Date Taking? Authorizing Provider  citalopram (CELEXA) 20 MG tablet Take 20 mg by mouth daily.   Yes Historical Provider, MD  lisinopril-hydrochlorothiazide (PRINZIDE,ZESTORETIC) 20-12.5 MG tablet Take 1 tablet by mouth daily.   Yes Historical Provider, MD  UNKNOWN TO PATIENT Take 1 tablet by  mouth daily as needed. FOR PAIN/SLEEP   Yes Historical Provider, MD  clindamycin (CLEOCIN) 300 MG capsule Take 1 capsule (300 mg total) by mouth 4 (four) times daily. X 7 days 12/10/16   Loren Racer, MD  ibuprofen (ADVIL,MOTRIN) 600 MG tablet Take 1 tablet (600 mg total) by mouth every 6 (six) hours as needed. 12/10/16   Loren Racer, MD    Family History Family History  Problem Relation Age of Onset  . Cancer Mother     breast  . CAD Other   . Hypertension Other   . Diabetes Other   . Cancer Other     stomach    Social History Social History  Substance Use Topics  . Smoking status: Former Games developer  . Smokeless tobacco: Never Used  . Alcohol use No     Allergies   Amoxicillin; Hydrocodone; and Tylenol [acetaminophen]   Review of Systems Review of Systems  Constitutional: Positive for chills and diaphoresis.  HENT: Positive for sore throat. Negative for congestion, facial swelling, rhinorrhea, sinus pressure, trouble swallowing and voice change.   Respiratory: Positive for cough.   Cardiovascular: Positive for chest pain.  Gastrointestinal: Positive for vomiting. Negative for abdominal pain, constipation, diarrhea and nausea.  Genitourinary: Negative for dysuria, flank pain and frequency.  Musculoskeletal: Positive for myalgias. Negative for back pain and neck pain.       Generalized body aches   Skin: Negative for rash and wound.  Neurological: Positive for headaches.  All other systems reviewed and  are negative.  Physical Exam Updated Vital Signs BP (!) 168/102 (BP Location: Left Arm)   Pulse 115   Temp 98.9 F (37.2 C) (Oral)   Resp 24   Ht 4\' 11"  (1.499 m)   Wt 200 lb (90.7 kg)   SpO2 100%   BMI 40.40 kg/m   Physical Exam  Constitutional: She is oriented to person, place, and time. She appears well-developed and well-nourished. No distress.  HENT:  Head: Normocephalic and atraumatic.  Mouth/Throat: Oropharyngeal exudate present.  Uvula is midline    Eyes: EOM are normal. Pupils are equal, round, and reactive to light.  Neck: Normal range of motion. Neck supple.  No meningismus  Cardiovascular: Normal rate, regular rhythm, normal heart sounds and intact distal pulses.  Exam reveals no gallop and no friction rub.   No murmur heard. Pulmonary/Chest: Effort normal and breath sounds normal. No respiratory distress. She has no wheezes. She has no rales. She exhibits no tenderness.  Abdominal: Soft. Bowel sounds are normal. She exhibits no distension and no mass. There is no tenderness. There is no rebound and no guarding. No hernia.  Musculoskeletal: Normal range of motion. She exhibits no edema or tenderness.  No CVA tenderness. No lower extremity swelling, asymmetry or tenderness.  Lymphadenopathy:    She has cervical adenopathy.  Neurological: She is alert and oriented to person, place, and time.  Moving all extremities without focal deficit. Sensation fully intact.  Skin: Skin is warm and dry. Capillary refill takes less than 2 seconds. No rash noted. No erythema.  Psychiatric: She has a normal mood and affect. Her behavior is normal.  Nursing note and vitals reviewed.    ED Treatments / Results  DIAGNOSTIC STUDIES: Oxygen Saturation is 100% on RA, normal by my interpretation.    COORDINATION OF CARE: 9:58 PM Discussed treatment plan with pt at bedside which includes Zithromax, Decadron, and Ibuprofen and pt agreed to plan.  Labs (all labs ordered are listed, but only abnormal results are displayed) Labs Reviewed  CBC WITH DIFFERENTIAL/PLATELET - Abnormal; Notable for the following:       Result Value   WBC 15.4 (*)    Neutro Abs 9.8 (*)    Monocytes Absolute 1.3 (*)    All other components within normal limits  COMPREHENSIVE METABOLIC PANEL - Abnormal; Notable for the following:    Potassium 3.3 (*)    Chloride 100 (*)    All other components within normal limits  TROPONIN I  HCG, QUANTITATIVE, PREGNANCY    EKG  EKG  Interpretation  Date/Time:  Friday December 09 2016 22:40:43 EST Ventricular Rate:  112 PR Interval:    QRS Duration: 87 QT Interval:  365 QTC Calculation: 499 R Axis:   100 Text Interpretation:  Sinus tachycardia Borderline right axis deviation Borderline repolarization abnormality Prolonged QT interval Baseline wander in lead(s) II III aVR aVF V5 V6 Confirmed by Ranae PalmsYELVERTON  MD, Damara Klunder ([id number]) on 12/10/2016 12:12:30 AM       Radiology Dg Chest 2 View  Result Date: 12/09/2016 CLINICAL DATA:  Cough and congestion. Sore throat and body aches. Chest pain. EXAM: CHEST  2 VIEW COMPARISON:  08/29/2016 FINDINGS: The cardiomediastinal contours are normal. Mild chronic interstitial coarsened. Pulmonary vasculature is normal. No consolidation, pleural effusion, or pneumothorax. No acute osseous abnormalities are seen. IMPRESSION: No acute abnormality. Electronically Signed   By: Rubye OaksMelanie  Ehinger M.D.   On: 12/09/2016 23:29    Procedures Procedures (including critical care time)  Medications Ordered in  ED Medications  azithromycin (ZITHROMAX) tablet 500 mg (500 mg Oral Given 12/09/16 2238)  dexamethasone (DECADRON) injection 10 mg (10 mg Intramuscular Given 12/09/16 2239)  ibuprofen (ADVIL,MOTRIN) tablet 600 mg (600 mg Oral Given 12/09/16 2238)     Initial Impression / Assessment and Plan / ED Course  I have reviewed the triage vital signs and the nursing notes.  Pertinent labs & imaging results that were available during my care of the patient were reviewed by me and considered in my medical decision making (see chart for details).     I personally performed the services described in this documentation, which was scribed in my presence. The recorded information has been reviewed and is accurate.     Final Clinical Impressions(s) / ED Diagnoses   Final diagnoses:  Strep tonsillitis  Atypical chest pain  Initially given azithromycin emergency department. Mildly prolonged QTC. We'll switch to  clindamycin for strep tonsillitis. Patient admitted to nursing staff she had some mild pleuritic chest pain. EKG without any acute abnormality. Low suspicion for coronary artery disease/ PE.   New Prescriptions New Prescriptions   CLINDAMYCIN (CLEOCIN) 300 MG CAPSULE    Take 1 capsule (300 mg total) by mouth 4 (four) times daily. X 7 days   IBUPROFEN (ADVIL,MOTRIN) 600 MG TABLET    Take 1 tablet (600 mg total) by mouth every 6 (six) hours as needed.     Loren Racer, MD 12/10/16 [phone]

## 2016-12-09 NOTE — ED Notes (Signed)
Pt to xray

## 2016-12-10 LAB — HCG, QUANTITATIVE, PREGNANCY

## 2016-12-10 LAB — TROPONIN I

## 2016-12-10 MED ORDER — CLINDAMYCIN HCL 300 MG PO CAPS: 300.0000 mg | ORAL_CAPSULE | Freq: Four times a day (QID) | ORAL | 0 refills | Status: DC

## 2016-12-10 MED ORDER — IBUPROFEN 600 MG PO TABS: 600.0000 mg | ORAL_TABLET | Freq: Four times a day (QID) | ORAL | 0 refills | Status: DC | PRN

## 2016-12-10 NOTE — ED Notes (Signed)
Patient called out stating that she felt like she was going to throw up and needed something to drink. Patient given Ginger ale at this time.

## 2016-12-10 NOTE — ED Notes (Signed)
Pt alert & oriented x4, stable gait. Patient given discharge instructions, paperwork & prescription(s). Patient  instructed to stop at the registration desk to finish any additional paperwork. Patient verbalized understanding. Pt left department w/ no further questions. 

## 2018-09-19 IMAGING — DX DG CHEST 2V
2 series · 2 of 2 positions shown · non-contrast
Comparison: 08/29/2016

CLINICAL DATA: Cough and congestion. Sore throat and body aches.
Chest pain.

EXAM:
CHEST  2 VIEW

[chest pa]
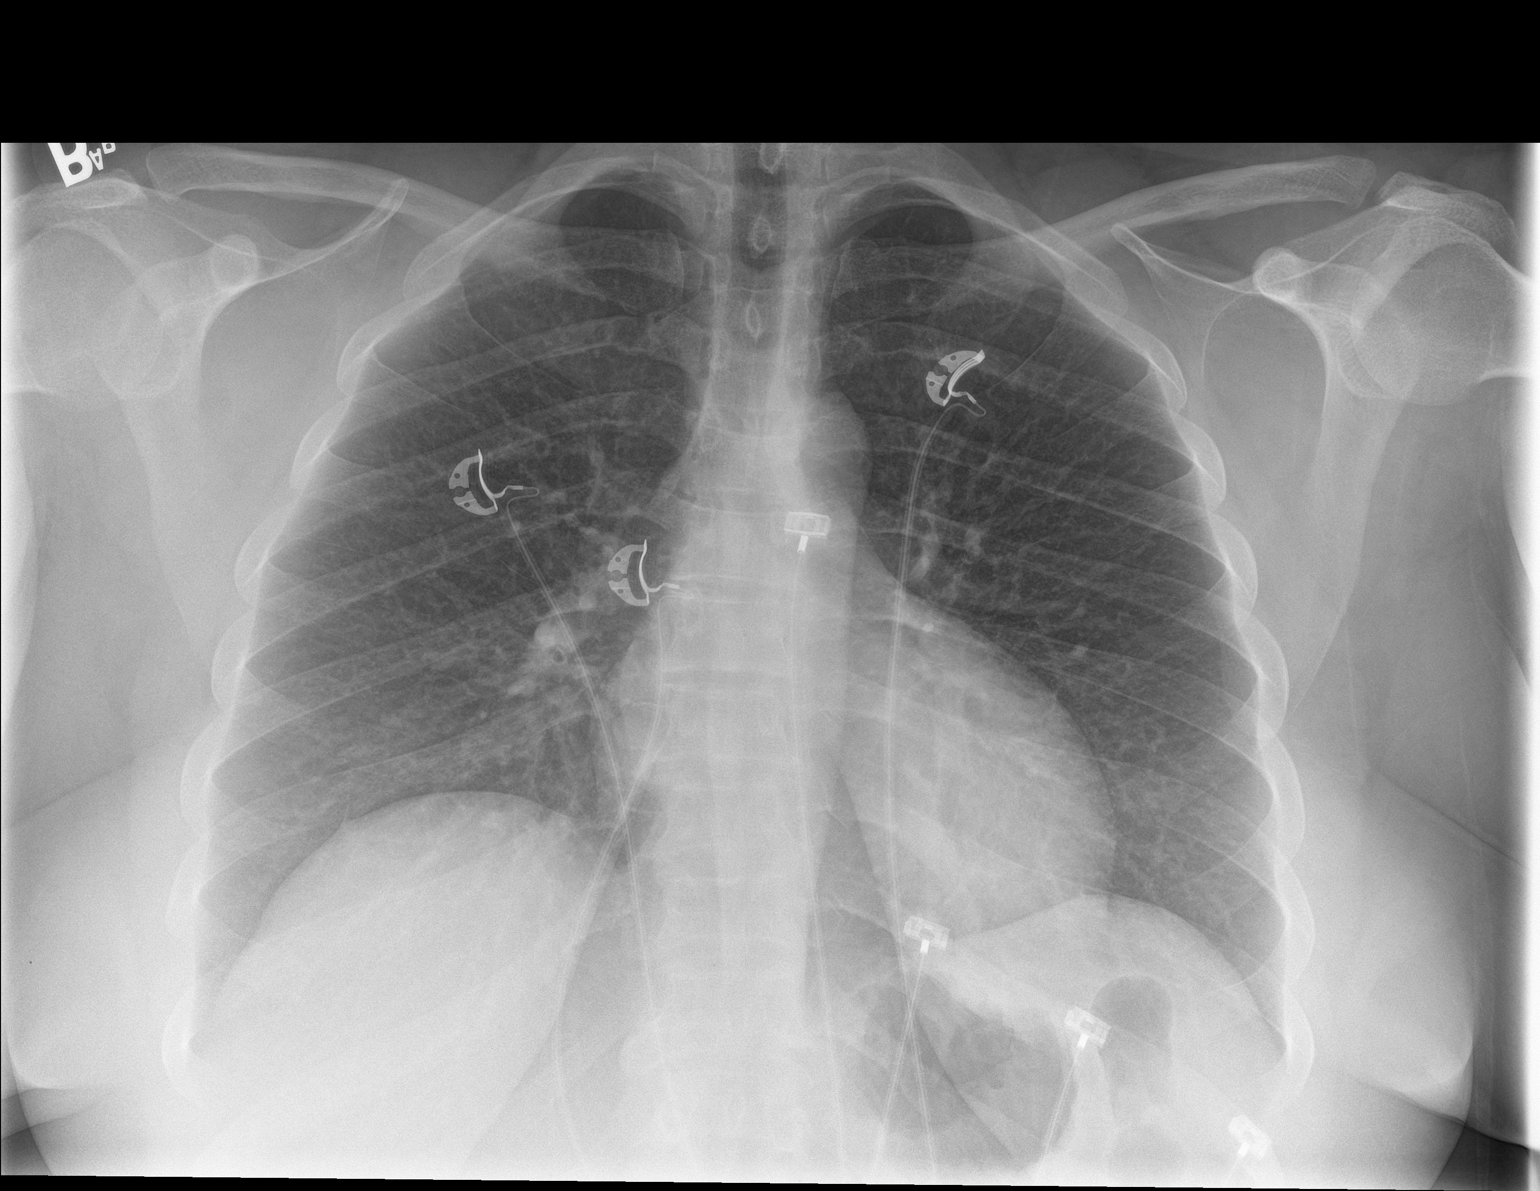

[chest lat]
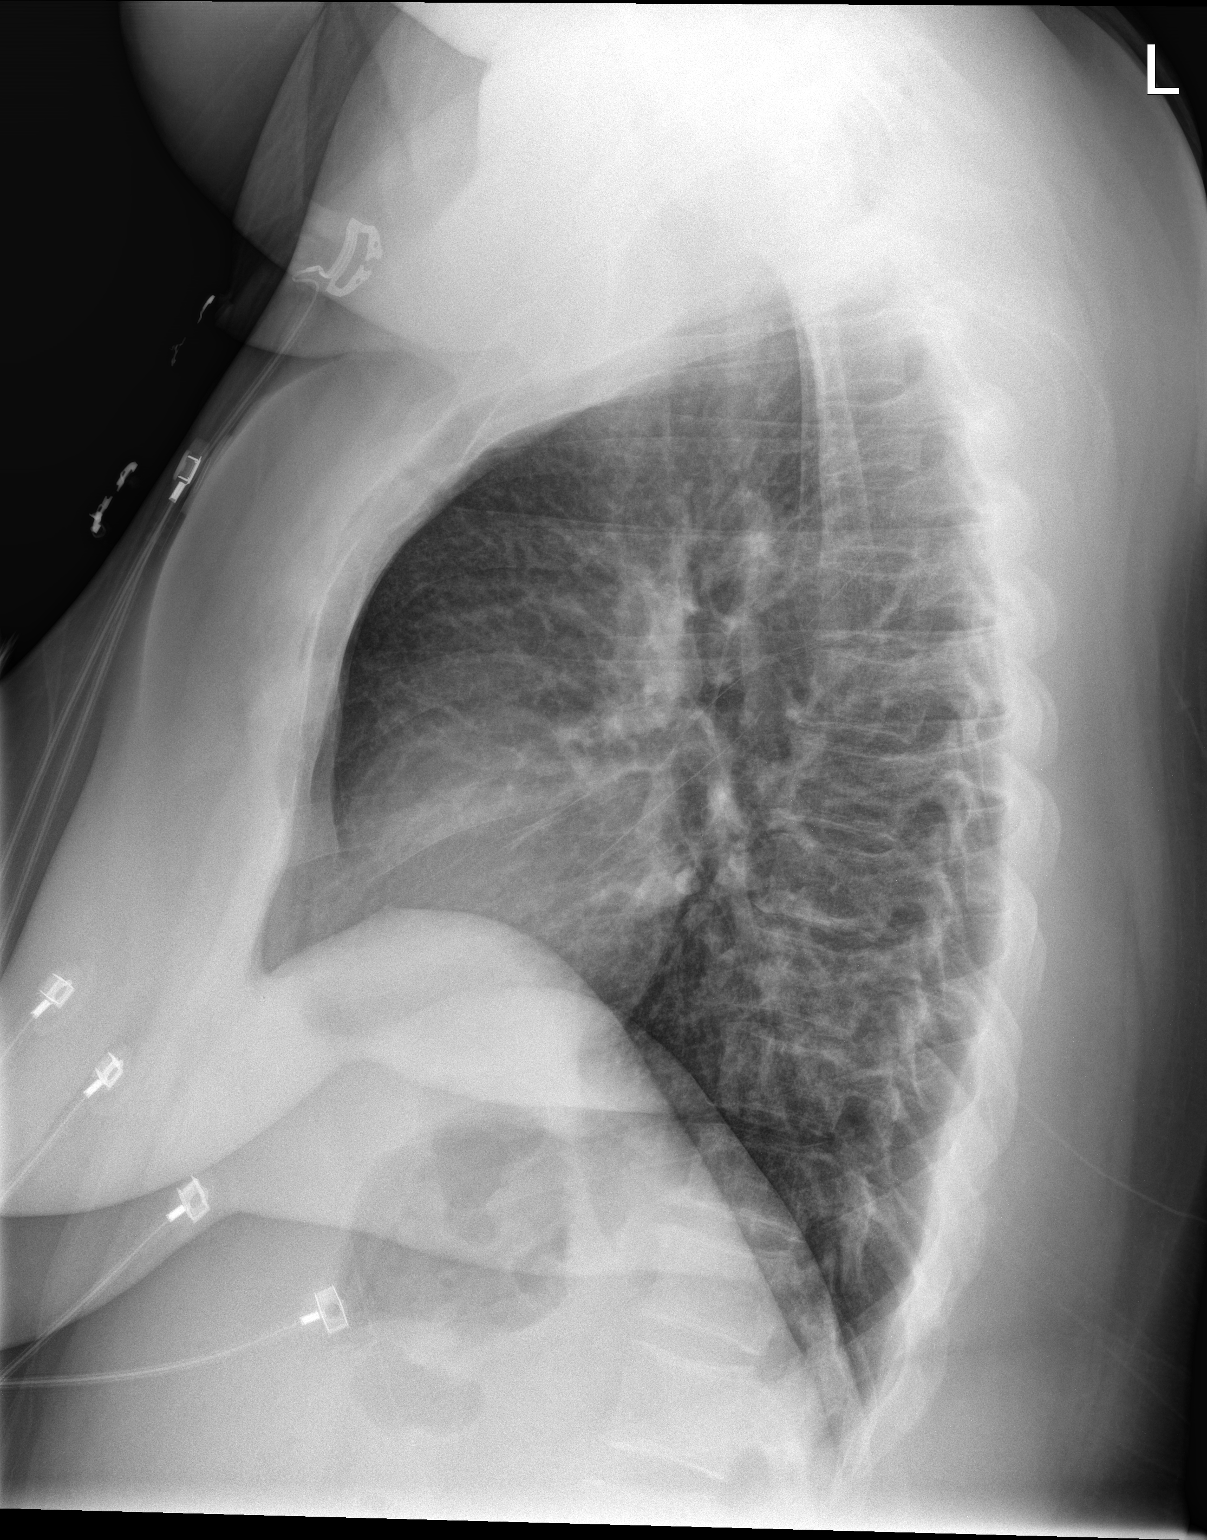

[2 of 2 positions shown; findings below may reference images not displayed]

FINDINGS: The cardiomediastinal contours are normal. Mild chronic interstitial
coarsened. Pulmonary vasculature is normal. No consolidation,
pleural effusion, or pneumothorax. No acute osseous abnormalities
are seen.
IMPRESSION: No acute abnormality.

## 2020-03-07 DEATH — deceased
